# Patient Record
Sex: Female | Born: 1960 | Race: White | Hispanic: No | Marital: Married | Smoking: Never smoker
Health system: Southern US, Community
[De-identification: ages and names within clinical notes are randomized; demographics above are authoritative.]

## PROBLEM LIST (undated history)

## (undated) DIAGNOSIS — M1990 Unspecified osteoarthritis, unspecified site: Secondary | ICD-10-CM

## (undated) DIAGNOSIS — E669 Obesity, unspecified: Secondary | ICD-10-CM

## (undated) DIAGNOSIS — N951 Menopausal and female climacteric states: Secondary | ICD-10-CM

## (undated) DIAGNOSIS — E785 Hyperlipidemia, unspecified: Secondary | ICD-10-CM

## (undated) DIAGNOSIS — C441192 Basal cell carcinoma of skin of left lower eyelid, including canthus: Secondary | ICD-10-CM

## (undated) DIAGNOSIS — R011 Cardiac murmur, unspecified: Secondary | ICD-10-CM

## (undated) DIAGNOSIS — R790 Abnormal level of blood mineral: Secondary | ICD-10-CM

## (undated) DIAGNOSIS — H409 Unspecified glaucoma: Secondary | ICD-10-CM

## (undated) DIAGNOSIS — T7840XA Allergy, unspecified, initial encounter: Secondary | ICD-10-CM

## (undated) DIAGNOSIS — B009 Herpesviral infection, unspecified: Secondary | ICD-10-CM

## (undated) DIAGNOSIS — I10 Essential (primary) hypertension: Secondary | ICD-10-CM

## (undated) DIAGNOSIS — C801 Malignant (primary) neoplasm, unspecified: Secondary | ICD-10-CM

## (undated) DIAGNOSIS — D649 Anemia, unspecified: Secondary | ICD-10-CM

## (undated) DIAGNOSIS — R251 Tremor, unspecified: Secondary | ICD-10-CM

## (undated) HISTORY — PX: MASS EXCISION: SHX2000

## (undated) HISTORY — DX: Hyperlipidemia, unspecified: E78.5

## (undated) HISTORY — DX: Allergy, unspecified, initial encounter: T78.40XA

## (undated) HISTORY — DX: Anemia, unspecified: D64.9

## (undated) HISTORY — DX: Unspecified glaucoma: H40.9

## (undated) HISTORY — DX: Herpesviral infection, unspecified: B00.9

## (undated) HISTORY — PX: TUBAL LIGATION: SHX77

## (undated) HISTORY — DX: Tremor, unspecified: R25.1

## (undated) HISTORY — PX: APPENDECTOMY: SHX54

## (undated) HISTORY — DX: Menopausal and female climacteric states: N95.1

## (undated) HISTORY — DX: Basal cell carcinoma of skin of left lower eyelid, including canthus: C44.1192

## (undated) HISTORY — DX: Abnormal level of blood mineral: R79.0

## (undated) HISTORY — DX: Unspecified osteoarthritis, unspecified site: M19.90

## (undated) HISTORY — DX: Essential (primary) hypertension: I10

## (undated) HISTORY — DX: Obesity, unspecified: E66.9

## (undated) HISTORY — PX: JOINT REPLACEMENT: SHX530

---

## 2009-01-03 ENCOUNTER — Encounter: Admission: RE | Admit: 2009-01-03 | Discharge: 2009-01-03 | Payer: Self-pay | Admitting: Orthopedic Surgery

## 2009-06-05 ENCOUNTER — Ambulatory Visit: Payer: Self-pay | Admitting: Family Medicine

## 2009-07-14 ENCOUNTER — Ambulatory Visit: Payer: Self-pay | Admitting: Family Medicine

## 2010-02-08 ENCOUNTER — Encounter: Payer: Self-pay | Admitting: Orthopedic Surgery

## 2010-12-08 ENCOUNTER — Ambulatory Visit: Payer: Self-pay | Admitting: Family Medicine

## 2011-07-21 ENCOUNTER — Encounter (HOSPITAL_COMMUNITY): Payer: Self-pay | Admitting: Pharmacy Technician

## 2011-07-27 ENCOUNTER — Other Ambulatory Visit (HOSPITAL_COMMUNITY): Payer: Self-pay

## 2011-07-28 NOTE — Pre-Procedure Instructions (Signed)
20 SIMRIT GOHLKE  07/28/2011   Your procedure is scheduled on:  08-04-2011  Report to [facility] at 6:30 AM.  Call this number if you have problems the morning of surgery: [phone]   Remember:   Do not eat food or drink:After Midnight.    .  Take these medicines the morning of surgery with A SIP OF WATER:    Do not wear jewelry, make-up or nail polish.  Do not wear lotions, powders, or perfumes. You may wear deodorant.  Do not shave 48 hours prior to surgery. Men may shave face and neck.  Do not bring valuables to the hospital.  Contacts, dentures or bridgework may not be worn into surgery.  Leave suitcase in the car. After surgery it may be brought to your room.  For patients admitted to the hospital, checkout time is 11:00 AM the day of discharge.      Special Instructions: Incentive Spirometry - Practice and bring it with you on the day of surgery. and CHG Shower Use Special Wash: 1/2 bottle night before surgery and 1/2 bottle morning of surgery.   Please read over the following fact sheets that you were given: Pain Booklet, Coughing and Deep Breathing, Blood Transfusion Information, MRSA Information and Surgical Site Infection Prevention

## 2011-07-28 NOTE — H&P (Signed)
  [facility] [address]   [address] [place], [place] [address] [phone] A Division of [facility]  Loreta Ave, M.D.     Robert A. Thurston Hole, M.D.     Lunette Stands, M.D. Eulas Post, M.D.    Buford Dresser, M.D. Estell Harpin, M.D. Ralene Cork, D.O.          Genene Churn. Barry Dienes, PA-C            Kirstin A. Shepperson, PA-C Janace Litten, OPA-C   RE: Veronica Morton, Veronica Morton                                [id number]      DOB: 09-23-1960 PROGRESS NOTE: 07-27-11 Chief complaint: Right knee pain.  History of present illness: 51 year-old white female with a history of end stage DJD, right knee, and chronic pain.  Returns.  States that knee symptoms are unchanged from previous visit.  She is wanting to proceed with total knee replacement as scheduled.   Current medications: [employer] sleep aid, iron supplement and Calcium supplement. Allergies: No known drug allergies. Past medical/surgical history: Tubal ligation and appendectomy. Review of systems: Patient denies lightheadedness, dizziness, fevers, chills, cardiac, pulmonary, GI, GU or neuro issues. Family history: Positive for heart disease, hypertension, diabetes, stroke, kidney disease and cancer. Social history: Denies smoking, admits occasional alcohol use.  Patient is married.        EXAMINATION: Height: 5?2.  Weight: 200 pounds.  Respirations: 20.  Blood pressure: 137/85.  Temperature: 98.7.  Pulse: 80.  Pleasant white female, alert and oriented x 3 and in no acute distress.  No increase in respiratory effort.  Gait is antalgic.  Head is normocephalic, a traumatic.  PERRLA, EOMI.  Cervical spine unremarkable.  Lungs: CTA bilaterally.  No wheezes.  Heart: RRR.  S1 and S2.  No murmurs.  Abdomen: Round and non-distended.  NBS x 4.  Soft and non-tender.  Right knee: Decreased range of motion.  Positive crepitus.  Joint line tender.  Positive effusion.  Ligaments  stable.  Calf non-tender.  Neurovascularly intact.  Skin warm and dry.   IMPRESSION: End stage DJD, right knee, and chronic pain.  Failed conservative treatment.  PLAN: We will proceed with right total knee replacement as scheduled.  Surgical procedure, along with potential rehab/recovery time discussed.  All questions answered.    Loreta Ave, M.D.   Electronically verified by Loreta Ave, M.D. DFM(JMO):jjh D 07-27-11 T 07-28-11

## 2011-07-29 ENCOUNTER — Encounter (HOSPITAL_COMMUNITY): Payer: Self-pay

## 2011-07-29 ENCOUNTER — Encounter (HOSPITAL_COMMUNITY): Payer: Self-pay | Admitting: Pharmacy Technician

## 2011-07-29 ENCOUNTER — Encounter (HOSPITAL_COMMUNITY)
Admission: RE | Admit: 2011-07-29 | Discharge: 2011-07-29 | Disposition: A | Discharge status: Home / Self Care | Payer: 59 | Source: Ambulatory Visit | Attending: Orthopedic Surgery | Admitting: Orthopedic Surgery

## 2011-07-29 ENCOUNTER — Ambulatory Visit (HOSPITAL_COMMUNITY)
Admission: RE | Admit: 2011-07-29 | Discharge: 2011-07-29 | Disposition: A | Discharge status: Home / Self Care | Payer: 59 | Source: Ambulatory Visit | Attending: Surgery | Admitting: Surgery

## 2011-07-29 DIAGNOSIS — Z01812 Encounter for preprocedural laboratory examination: Secondary | ICD-10-CM | POA: Insufficient documentation

## 2011-07-29 DIAGNOSIS — Z01810 Encounter for preprocedural cardiovascular examination: Secondary | ICD-10-CM | POA: Insufficient documentation

## 2011-07-29 DIAGNOSIS — Z01818 Encounter for other preprocedural examination: Secondary | ICD-10-CM | POA: Insufficient documentation

## 2011-07-29 HISTORY — DX: Cardiac murmur, unspecified: R01.1

## 2011-07-29 LAB — URINALYSIS, ROUTINE W REFLEX MICROSCOPIC
Glucose, UA: NEGATIVE mg/dL
Ketones, ur: NEGATIVE mg/dL
Leukocytes, UA: NEGATIVE
Specific Gravity, Urine: 1.031 — ABNORMAL HIGH (ref 1.005–1.030)
pH: 6 (ref 5.0–8.0)

## 2011-07-29 LAB — PROTIME-INR
INR: 1.03 (ref 0.00–1.49)
Prothrombin Time: 13.7 seconds (ref 11.6–15.2)

## 2011-07-29 LAB — COMPREHENSIVE METABOLIC PANEL
AST: 23 U/L (ref 0–37)
BUN: 12 mg/dL (ref 6–23)
CO2: 25 mEq/L (ref 19–32)
Chloride: 105 mEq/L (ref 96–112)
Creatinine, Ser: 0.75 mg/dL (ref 0.50–1.10)
GFR calc non Af Amer: >90 mL/min (ref >90)
Total Bilirubin: 0.9 mg/dL (ref 0.3–1.2)

## 2011-07-29 LAB — CBC
HCT: 39.2 % (ref 36.0–46.0)
MCV: 82.5 fL (ref 78.0–100.0)
RBC: 4.75 MIL/uL (ref 3.87–5.11)
WBC: 7.1 10*3/uL (ref 4.0–10.5)

## 2011-07-29 LAB — SURGICAL PCR SCREEN: Staphylococcus aureus: NEGATIVE

## 2011-08-03 MED ORDER — CEFAZOLIN SODIUM-DEXTROSE 2-3 GM-% IV SOLR
2.0000 g | INTRAVENOUS | Status: AC
  Administered 2011-08-04: 2 g via INTRAVENOUS
  Filled 2011-08-03: qty 50

## 2011-08-04 ENCOUNTER — Encounter (HOSPITAL_COMMUNITY): Payer: Self-pay | Admitting: Anesthesiology

## 2011-08-04 ENCOUNTER — Encounter (HOSPITAL_COMMUNITY)
Admission: RE | Disposition: A | Discharge status: Home / Self Care | Payer: Self-pay | Source: Ambulatory Visit | Attending: Orthopedic Surgery

## 2011-08-04 ENCOUNTER — Inpatient Hospital Stay (HOSPITAL_COMMUNITY)
Admission: RE | Admit: 2011-08-04 | Discharge: 2011-08-06 | DRG: 470 | Disposition: A | Discharge status: Home / Self Care | Payer: 59 | Source: Ambulatory Visit | Attending: Orthopedic Surgery | Admitting: Orthopedic Surgery

## 2011-08-04 ENCOUNTER — Inpatient Hospital Stay (HOSPITAL_COMMUNITY): Payer: 59

## 2011-08-04 ENCOUNTER — Ambulatory Visit (HOSPITAL_COMMUNITY): Payer: 59 | Admitting: Anesthesiology

## 2011-08-04 ENCOUNTER — Encounter (HOSPITAL_COMMUNITY): Payer: Self-pay | Admitting: *Deleted

## 2011-08-04 DIAGNOSIS — G8929 Other chronic pain: Secondary | ICD-10-CM | POA: Diagnosis present

## 2011-08-04 DIAGNOSIS — M1710 Unilateral primary osteoarthritis, unspecified knee: Principal | ICD-10-CM | POA: Diagnosis present

## 2011-08-04 DIAGNOSIS — Z471 Aftercare following joint replacement surgery: Secondary | ICD-10-CM

## 2011-08-04 HISTORY — PX: TOTAL KNEE ARTHROPLASTY: SHX125

## 2011-08-04 SURGERY — ARTHROPLASTY, KNEE, TOTAL
Anesthesia: General | Site: Knee | Laterality: Right | Wound class: Clean

## 2011-08-04 MED ORDER — MORPHINE SULFATE 4 MG/ML IJ SOLN
INTRAMUSCULAR | Status: AC
  Filled 2011-08-04: qty 1

## 2011-08-04 MED ORDER — ONDANSETRON HCL 4 MG/2ML IJ SOLN
4.0000 mg | Freq: Four times a day (QID) | INTRAMUSCULAR | Status: DC | PRN
  Administered 2011-08-05: 4 mg via INTRAVENOUS
  Filled 2011-08-04: qty 2

## 2011-08-04 MED ORDER — DEXAMETHASONE SODIUM PHOSPHATE 4 MG/ML IJ SOLN
INTRAMUSCULAR | Status: DC | PRN
  Administered 2011-08-04: 4 mg via INTRAVENOUS

## 2011-08-04 MED ORDER — BISACODYL 10 MG RE SUPP: 10.0000 mg | Freq: Every day | RECTAL | Status: DC | PRN

## 2011-08-04 MED ORDER — ONDANSETRON HCL 4 MG/2ML IJ SOLN: 4.0000 mg | Freq: Once | INTRAMUSCULAR | Status: DC | PRN

## 2011-08-04 MED ORDER — MIDAZOLAM HCL 5 MG/5ML IJ SOLN
INTRAMUSCULAR | Status: DC | PRN
  Administered 2011-08-04: 2 mg via INTRAVENOUS

## 2011-08-04 MED ORDER — HYDROMORPHONE HCL PF 1 MG/ML IJ SOLN
0.2500 mg | INTRAMUSCULAR | Status: DC | PRN
  Administered 2011-08-04: 0.25 mg via INTRAVENOUS

## 2011-08-04 MED ORDER — ONDANSETRON HCL 4 MG/2ML IJ SOLN: 4.0000 mg | Freq: Four times a day (QID) | INTRAMUSCULAR | Status: DC | PRN

## 2011-08-04 MED ORDER — OXYCODONE-ACETAMINOPHEN 5-325 MG PO TABS
ORAL_TABLET | ORAL | Status: AC
  Administered 2011-08-05: 1 via ORAL
  Filled 2011-08-04: qty 2

## 2011-08-04 MED ORDER — NALOXONE HCL 0.4 MG/ML IJ SOLN: 0.4000 mg | INTRAMUSCULAR | Status: DC | PRN

## 2011-08-04 MED ORDER — MORPHINE SULFATE 4 MG/ML IJ SOLN
INTRAMUSCULAR | Status: DC | PRN
  Administered 2011-08-04: 4 mg via INTRAVENOUS

## 2011-08-04 MED ORDER — ACETAMINOPHEN 325 MG PO TABS: 650.0000 mg | ORAL_TABLET | Freq: Four times a day (QID) | ORAL | Status: DC | PRN

## 2011-08-04 MED ORDER — POTASSIUM CHLORIDE IN NACL 20-0.9 MEQ/L-% IV SOLN
INTRAVENOUS | Status: DC
  Administered 2011-08-04 – 2011-08-05 (×2): via INTRAVENOUS
  Filled 2011-08-04 (×6): qty 1000

## 2011-08-04 MED ORDER — ACETAMINOPHEN 650 MG RE SUPP: 650.0000 mg | Freq: Four times a day (QID) | RECTAL | Status: DC | PRN

## 2011-08-04 MED ORDER — ACETAMINOPHEN 10 MG/ML IV SOLN
INTRAVENOUS | Status: DC | PRN
  Administered 2011-08-04: 1000 mg via INTRAVENOUS

## 2011-08-04 MED ORDER — WARFARIN - PHARMACIST DOSING INPATIENT
Freq: Every day | Status: DC
  Administered 2011-08-04: 18:00:00

## 2011-08-04 MED ORDER — METHOCARBAMOL 500 MG PO TABS
500.0000 mg | ORAL_TABLET | Freq: Four times a day (QID) | ORAL | Status: DC | PRN
  Administered 2011-08-05 – 2011-08-06 (×3): 500 mg via ORAL
  Filled 2011-08-04 (×3): qty 1

## 2011-08-04 MED ORDER — ALUM & MAG HYDROXIDE-SIMETH 200-200-20 MG/5ML PO SUSP: 30.0000 mL | ORAL | Status: DC | PRN

## 2011-08-04 MED ORDER — WARFARIN VIDEO
Freq: Once | Status: AC
  Administered 2011-08-05: 12:00:00

## 2011-08-04 MED ORDER — BUPIVACAINE HCL (PF) 0.25 % IJ SOLN
INTRAMUSCULAR | Status: DC | PRN
  Administered 2011-08-04: 30 mL

## 2011-08-04 MED ORDER — SENNOSIDES-DOCUSATE SODIUM 8.6-50 MG PO TABS: 1.0000 | ORAL_TABLET | Freq: Every evening | ORAL | Status: DC | PRN

## 2011-08-04 MED ORDER — LIDOCAINE HCL (CARDIAC) 20 MG/ML IV SOLN
INTRAVENOUS | Status: DC | PRN
  Administered 2011-08-04: 80 mg via INTRAVENOUS

## 2011-08-04 MED ORDER — COUMADIN BOOK
Freq: Once | Status: AC
  Administered 2011-08-04: 14:00:00
  Filled 2011-08-04: qty 1

## 2011-08-04 MED ORDER — BUPIVACAINE-EPINEPHRINE PF 0.5-1:200000 % IJ SOLN
INTRAMUSCULAR | Status: DC | PRN
  Administered 2011-08-04: 20 mL

## 2011-08-04 MED ORDER — ENOXAPARIN SODIUM 30 MG/0.3ML ~~LOC~~ SOLN
30.0000 mg | Freq: Two times a day (BID) | SUBCUTANEOUS | Status: DC
  Administered 2011-08-05 – 2011-08-06 (×3): 30 mg via SUBCUTANEOUS
  Filled 2011-08-04 (×5): qty 0.3

## 2011-08-04 MED ORDER — DOCUSATE SODIUM 100 MG PO CAPS
100.0000 mg | ORAL_CAPSULE | Freq: Two times a day (BID) | ORAL | Status: DC
  Administered 2011-08-04 – 2011-08-06 (×4): 100 mg via ORAL
  Filled 2011-08-04 (×5): qty 1

## 2011-08-04 MED ORDER — ONDANSETRON HCL 4 MG/2ML IJ SOLN
INTRAMUSCULAR | Status: DC | PRN
  Administered 2011-08-04: 4 mg via INTRAVENOUS

## 2011-08-04 MED ORDER — ACETAMINOPHEN 10 MG/ML IV SOLN
INTRAVENOUS | Status: AC
  Filled 2011-08-04: qty 100

## 2011-08-04 MED ORDER — LACTATED RINGERS IV SOLN
INTRAVENOUS | Status: DC | PRN
  Administered 2011-08-04 (×2): via INTRAVENOUS

## 2011-08-04 MED ORDER — HYDROMORPHONE HCL PF 1 MG/ML IJ SOLN
INTRAMUSCULAR | Status: AC
  Filled 2011-08-04: qty 1

## 2011-08-04 MED ORDER — OXYCODONE-ACETAMINOPHEN 5-325 MG PO TABS
1.0000 | ORAL_TABLET | ORAL | Status: DC | PRN
  Administered 2011-08-04: 2 via ORAL
  Administered 2011-08-05 (×3): 1 via ORAL
  Administered 2011-08-06: 2 via ORAL
  Administered 2011-08-06 (×3): 1 via ORAL
  Filled 2011-08-04 (×2): qty 1
  Filled 2011-08-04: qty 2
  Filled 2011-08-04 (×4): qty 1

## 2011-08-04 MED ORDER — FENTANYL CITRATE 0.05 MG/ML IJ SOLN
INTRAMUSCULAR | Status: DC | PRN
  Administered 2011-08-04: 100 ug via INTRAVENOUS
  Administered 2011-08-04 (×2): 50 ug via INTRAVENOUS

## 2011-08-04 MED ORDER — DEXTROSE 5 % IV SOLN
500.0000 mg | Freq: Four times a day (QID) | INTRAVENOUS | Status: DC | PRN
  Filled 2011-08-04: qty 5

## 2011-08-04 MED ORDER — SODIUM CHLORIDE 0.9 % IR SOLN
Status: DC | PRN
  Administered 2011-08-04: 1000 mL
  Administered 2011-08-04: 3000 mL

## 2011-08-04 MED ORDER — WARFARIN SODIUM 7.5 MG PO TABS
7.5000 mg | ORAL_TABLET | Freq: Once | ORAL | Status: AC
  Administered 2011-08-04: 7.5 mg via ORAL
  Filled 2011-08-04: qty 1

## 2011-08-04 MED ORDER — MORPHINE SULFATE (PF) 1 MG/ML IV SOLN
INTRAVENOUS | Status: DC
  Administered 2011-08-04: 21:00:00 via INTRAVENOUS
  Administered 2011-08-04: 28.03 mg via INTRAVENOUS
  Administered 2011-08-04: 12:00:00 via INTRAVENOUS
  Administered 2011-08-05: 6 mg via INTRAVENOUS
  Administered 2011-08-05: 06:00:00 via INTRAVENOUS
  Administered 2011-08-05: 6 mg via INTRAVENOUS
  Filled 2011-08-04 (×4): qty 25

## 2011-08-04 MED ORDER — SODIUM CHLORIDE 0.9 % IJ SOLN: 9.0000 mL | INTRAMUSCULAR | Status: DC | PRN

## 2011-08-04 MED ORDER — METHOCARBAMOL 500 MG PO TABS
ORAL_TABLET | ORAL | Status: AC
  Administered 2011-08-05: 500 mg via ORAL
  Filled 2011-08-04: qty 1

## 2011-08-04 MED ORDER — DIPHENHYDRAMINE HCL 50 MG/ML IJ SOLN: 12.5000 mg | Freq: Four times a day (QID) | INTRAMUSCULAR | Status: DC | PRN

## 2011-08-04 MED ORDER — CEFAZOLIN SODIUM 1-5 GM-% IV SOLN
1.0000 g | Freq: Three times a day (TID) | INTRAVENOUS | Status: AC
  Administered 2011-08-04 – 2011-08-05 (×3): 1 g via INTRAVENOUS
  Filled 2011-08-04 (×4): qty 50

## 2011-08-04 MED ORDER — PROPOFOL 10 MG/ML IV BOLUS
INTRAVENOUS | Status: DC | PRN
  Administered 2011-08-04: 200 mg via INTRAVENOUS

## 2011-08-04 MED ORDER — BUPIVACAINE HCL (PF) 0.25 % IJ SOLN
INTRAMUSCULAR | Status: AC
  Filled 2011-08-04: qty 30

## 2011-08-04 MED ORDER — PHENOL 1.4 % MT LIQD: 1.0000 | OROMUCOSAL | Status: DC | PRN

## 2011-08-04 MED ORDER — DIPHENHYDRAMINE HCL 12.5 MG/5ML PO ELIX
12.5000 mg | ORAL_SOLUTION | Freq: Four times a day (QID) | ORAL | Status: DC | PRN
  Administered 2011-08-04: 12.5 mg via ORAL
  Filled 2011-08-04: qty 5

## 2011-08-04 MED ORDER — MORPHINE SULFATE (PF) 1 MG/ML IV SOLN
INTRAVENOUS | Status: AC
  Filled 2011-08-04: qty 25

## 2011-08-04 MED ORDER — MENTHOL 3 MG MT LOZG: 1.0000 | LOZENGE | OROMUCOSAL | Status: DC | PRN

## 2011-08-04 MED ORDER — ONDANSETRON HCL 4 MG PO TABS: 4.0000 mg | ORAL_TABLET | Freq: Four times a day (QID) | ORAL | Status: DC | PRN

## 2011-08-04 SURGICAL SUPPLY — 57 items
BANDAGE ESMARK 6X9 LF (GAUZE/BANDAGES/DRESSINGS) ×1 IMPLANT
BLADE SAG 18X100X1.27 (BLADE) ×4 IMPLANT
BNDG CMPR 9X6 STRL LF SNTH (GAUZE/BANDAGES/DRESSINGS) ×1
BNDG ESMARK 6X9 LF (GAUZE/BANDAGES/DRESSINGS) ×2
BOOTCOVER CLEANROOM LRG (PROTECTIVE WEAR) ×4 IMPLANT
BOWL SMART MIX CTS (DISPOSABLE) ×2 IMPLANT
CEMENT BONE SIMPLEX SPEEDSET (Cement) ×4 IMPLANT
CLOTH BEACON ORANGE TIMEOUT ST (SAFETY) ×2 IMPLANT
COVER BACK TABLE 24X17X13 BIG (DRAPES) ×1 IMPLANT
COVER SURGICAL LIGHT HANDLE (MISCELLANEOUS) ×2 IMPLANT
CUFF TOURNIQUET SINGLE 34IN LL (TOURNIQUET CUFF) ×2 IMPLANT
DRAPE EXTREMITY T 121X128X90 (DRAPE) ×2 IMPLANT
DRAPE PROXIMA HALF (DRAPES) ×2 IMPLANT
DRAPE U-SHAPE 47X51 STRL (DRAPES) ×2 IMPLANT
DRSG PAD ABDOMINAL 8X10 ST (GAUZE/BANDAGES/DRESSINGS) ×3 IMPLANT
DURAPREP 26ML APPLICATOR (WOUND CARE) ×2 IMPLANT
ELECT CAUTERY BLADE 6.4 (BLADE) ×2 IMPLANT
ELECT REM PT RETURN 9FT ADLT (ELECTROSURGICAL) ×2
ELECTRODE REM PT RTRN 9FT ADLT (ELECTROSURGICAL) ×1 IMPLANT
EVACUATOR 1/8 PVC DRAIN (DRAIN) ×2 IMPLANT
FACESHIELD LNG OPTICON STERILE (SAFETY) ×2 IMPLANT
GAUZE XEROFORM 5X9 LF (GAUZE/BANDAGES/DRESSINGS) ×2 IMPLANT
GLOVE BIOGEL PI IND STRL 8 (GLOVE) ×1 IMPLANT
GLOVE BIOGEL PI INDICATOR 8 (GLOVE) ×1
GLOVE ORTHO TXT STRL SZ7.5 (GLOVE) ×4 IMPLANT
GLOVE SURG SS PI 7.5 STRL IVOR (GLOVE) ×1 IMPLANT
GOWN PREVENTION PLUS XLARGE (GOWN DISPOSABLE) ×3 IMPLANT
GOWN STRL NON-REIN LRG LVL3 (GOWN DISPOSABLE) ×3 IMPLANT
GOWN STRL REIN 2XL XLG LVL4 (GOWN DISPOSABLE) ×2 IMPLANT
HANDPIECE INTERPULSE COAX TIP (DISPOSABLE) ×2
IMMOBILIZER KNEE 22 UNIV (SOFTGOODS) ×2 IMPLANT
IMMOBILIZER KNEE 24 THIGH 36 (MISCELLANEOUS) IMPLANT
IMMOBILIZER KNEE 24 UNIV (MISCELLANEOUS)
KIT BASIN OR (CUSTOM PROCEDURE TRAY) ×2 IMPLANT
KIT ROOM TURNOVER OR (KITS) ×2 IMPLANT
MANIFOLD NEPTUNE II (INSTRUMENTS) ×2 IMPLANT
NS IRRIG 1000ML POUR BTL (IV SOLUTION) ×2 IMPLANT
PACK TOTAL JOINT (CUSTOM PROCEDURE TRAY) ×2 IMPLANT
PAD ARMBOARD 7.5X6 YLW CONV (MISCELLANEOUS) ×4 IMPLANT
PAD CAST 4YDX4 CTTN HI CHSV (CAST SUPPLIES) ×1 IMPLANT
PADDING CAST COTTON 4X4 STRL (CAST SUPPLIES) ×2
PADDING CAST COTTON 6X4 STRL (CAST SUPPLIES) ×2 IMPLANT
RUBBERBAND STERILE (MISCELLANEOUS) ×2 IMPLANT
SET HNDPC FAN SPRY TIP SCT (DISPOSABLE) ×1 IMPLANT
SPONGE GAUZE 4X4 12PLY (GAUZE/BANDAGES/DRESSINGS) ×2 IMPLANT
STAPLER VISISTAT 35W (STAPLE) ×2 IMPLANT
SUCTION FRAZIER TIP 10 FR DISP (SUCTIONS) ×2 IMPLANT
SUT VIC AB 1 CTX 36 (SUTURE) ×4
SUT VIC AB 1 CTX36XBRD ANBCTR (SUTURE) ×2 IMPLANT
SUT VIC AB 2-0 CT1 27 (SUTURE) ×6
SUT VIC AB 2-0 CT1 TAPERPNT 27 (SUTURE) ×2 IMPLANT
SYR 30ML LL (SYRINGE) ×1 IMPLANT
SYR 30ML SLIP (SYRINGE) ×2 IMPLANT
TOWEL OR 17X24 6PK STRL BLUE (TOWEL DISPOSABLE) ×2 IMPLANT
TOWEL OR 17X26 10 PK STRL BLUE (TOWEL DISPOSABLE) ×2 IMPLANT
TRAY FOLEY CATH 14FR (SET/KITS/TRAYS/PACK) ×2 IMPLANT
WATER STERILE IRR 1000ML POUR (IV SOLUTION) ×4 IMPLANT

## 2011-08-04 NOTE — Transfer of Care (Signed)
Immediate Anesthesia Transfer of Care Note  Patient: Veronica Morton  Procedure(s) Performed: Procedure(s) (LRB): TOTAL KNEE ARTHROPLASTY (Right)  Patient Location: PACU  Anesthesia Type: General  Level of Consciousness: awake, alert  and oriented  Airway & Oxygen Therapy: Patient Spontanous Breathing and Patient connected to nasal cannula oxygen  Post-op Assessment: Report given to PACU RN and Post -op Vital signs reviewed and stable  Post vital signs: Reviewed and stable  Complications: No apparent anesthesia complications

## 2011-08-04 NOTE — Progress Notes (Signed)
ANTICOAGULATION CONSULT NOTE - Initial Consult  Pharmacy Consult for warfarin Indication: VTE prophylaxis  No Known Allergies  Patient Measurements: Ht: 62 in Wt: 90.6 kg IBW: 50.1 kg AdjBW: 66.3 kg  Vital Signs: Temp: 97.9 F (36.6 C) (07/17 1010) Temp src: Oral (07/17 0706) BP: 132/88 mmHg (07/17 1035) Pulse Rate: 76  (07/17 1042)  Labs: (7/11) Est. CrCl 46mL/min (using AdjBW) INR- 1.03 Hgb- 13.4 Hct- 39.2 Plt- 204   Medical History: Past Medical History  Diagnosis Date  . Murmur     Medications:  Scheduled:     .  ceFAZolin (ANCEF) IV  1 g Intravenous Q8H  .  ceFAZolin (ANCEF) IV  2 g Intravenous 60 min Pre-Op  . docusate sodium  100 mg Oral BID  . enoxaparin (LOVENOX) injection  30 mg Subcutaneous Q12H  . HYDROmorphone      . methocarbamol      . morphine   Intravenous Q4H  . morphine      . morphine      . oxyCODONE-acetaminophen        Assessment: 50 YOF s/p total knee arthroplasty on 7/17. Pharmacy consulted to dose warfarin for VTE prophylaxis. Patient is not currently taking any inducers/inhibitors of warfarin. Baseline labs are WNL. Pt also on Lovenox for VTE prophylaxis  Goal of Therapy:  INR 2-3 Monitor platelets by anticoagulation protocol: Yes   Plan:  1. Warfarin 7.5mg  po x1 tonight 2. Daily PT/INR 3. Follow CBC and SCr 4. Will order warfarin educational video and book 5. Follow-up discontinuation of Lovenox when INR therapeutic   Maricella Filyaw D. Yesica Kemler, PharmD Clinical Pharmacist Pager: [phone] Phone: [phone] 08/04/2011 11:48 AM

## 2011-08-04 NOTE — Anesthesia Preprocedure Evaluation (Signed)
Anesthesia Evaluation  Patient identified by MRN, date of birth, ID band Patient awake    Reviewed: Allergy & Precautions, H&P , NPO status , Patient's Chart, lab work & pertinent test results  Airway Mallampati: I TM Distance: >3 FB Neck ROM: full    Dental   Pulmonary          Cardiovascular Rhythm:regular Rate:Normal     Neuro/Psych    GI/Hepatic   Endo/Other    Renal/GU      Musculoskeletal   Abdominal   Peds  Hematology   Anesthesia Other Findings   Reproductive/Obstetrics                           Anesthesia Physical Anesthesia Plan  ASA: I  Anesthesia Plan: General   Post-op Pain Management:    Induction: Intravenous  Airway Management Planned: LMA and Oral ETT  Additional Equipment:   Intra-op Plan:   Post-operative Plan: Extubation in OR  Informed Consent: I have reviewed the patients History and Physical, chart, labs and discussed the procedure including the risks, benefits and alternatives for the proposed anesthesia with the patient or authorized representative who has indicated his/her understanding and acceptance.     Plan Discussed with: CRNA, Anesthesiologist and Surgeon  Anesthesia Plan Comments:         Anesthesia Quick Evaluation  

## 2011-08-04 NOTE — Anesthesia Procedure Notes (Addendum)
Procedure Name: LMA Insertion Date/Time: 08/04/2011 7:45 AM Performed by: Marena Chancy Pre-anesthesia Checklist: Patient identified, Timeout performed, Emergency Drugs available, Suction available and Patient being monitored Patient Re-evaluated:Patient Re-evaluated prior to inductionOxygen Delivery Method: Circle system utilized Preoxygenation: Pre-oxygenation with 100% oxygen Intubation Type: IV induction LMA: LMA inserted LMA Size: 4.0 Number of attempts: 1 Placement Confirmation: breath sounds checked- equal and bilateral and positive ETCO2 Tube secured with: Tape Dental Injury: Teeth and Oropharynx as per pre-operative assessment    Anesthesia Regional Block:  Femoral nerve block  Pre-Anesthetic Checklist: ,, timeout performed, Correct Patient, Correct Site, Correct Laterality, Correct Procedure, Correct Position, site marked, Risks and benefits discussed,  Surgical consent,  Pre-op evaluation,  At surgeon's request and post-op pain management  Laterality: Right  Prep: Maximum Sterile Barrier Precautions used, chloraprep and alcohol swabs       Needles:  Injection technique: Single-shot  Needle Type: Stimulator Needle - 80        Needle insertion depth: 6 cm   Additional Needles:  Procedures: nerve stimulator Femoral nerve block  Nerve Stimulator or Paresthesia:  Response: 0.5 mA, 0.1 ms, 6 cm  Additional Responses:   Narrative:  Start time: 08/04/2011 8:10 AM End time: 08/04/2011 8:15 AM Injection made incrementally with aspirations every 5 mL.  Performed by: Personally  Anesthesiologist: Maren Beach MD  Additional Notes: 22cc 0.5% Marcaine w/ epi w/o difficulty or discomfort GES

## 2011-08-04 NOTE — Progress Notes (Signed)
I have reviewed the resident's note and agree with the assessment and plan.  [place], [address].D., BCPS Clinical Pharmacist Pager: [phone] 08/04/2011 12:00 PM

## 2011-08-04 NOTE — Progress Notes (Signed)
Unable to verify meds and chart due to chart opened.

## 2011-08-04 NOTE — Interval H&P Note (Signed)
History and Physical Interval Note:  08/04/2011 8:07 AM  Veronica Morton  has presented today for surgery, with the diagnosis of DJD RIGHT KNEE  The various methods of treatment have been discussed with the patient and family. After consideration of risks, benefits and other options for treatment, the patient has consented to  Procedure(s) (LRB): TOTAL KNEE ARTHROPLASTY (Right) as a surgical intervention .  The patient's history has been reviewed, patient examined, no change in status, stable for surgery.  I have reviewed the patients' chart and labs.  Questions were answered to the patient's satisfaction.     Quanta Robertshaw F

## 2011-08-04 NOTE — Anesthesia Postprocedure Evaluation (Signed)
  Anesthesia Post-op Note  Patient: Veronica Morton  Procedure(s) Performed: Procedure(s) (LRB): TOTAL KNEE ARTHROPLASTY (Right)  Patient Location: PACU and PICU  Anesthesia Type: GA combined with regional for post-op pain  Level of Consciousness: awake, alert , oriented and patient cooperative  Airway and Oxygen Therapy: Patient Spontanous Breathing and Patient connected to nasal cannula oxygen  Post-op Pain: mild  Post-op Assessment: Post-op Vital signs reviewed, Patient's Cardiovascular Status Stable, Respiratory Function Stable, Patent Airway, No signs of Nausea or vomiting and Pain level controlled  Post-op Vital Signs: stable  Complications: No apparent anesthesia complications

## 2011-08-04 NOTE — Progress Notes (Signed)
Orthopedic Tech Progress Note Patient Details:  Veronica Morton 03-09-1960 [id number]  CPM Right Knee CPM Right Knee: On Right Knee Flexion (Degrees): 60  Right Knee Extension (Degrees): 0  Additional Comments: trapeze bar   Cammer, Mickie Bail 08/04/2011, 1:07 PM

## 2011-08-04 NOTE — Brief Op Note (Signed)
08/04/2011  12:50 PM  PATIENT:  [name]  51 y.o. female  PRE-OPERATIVE DIAGNOSIS:  Degererative joint disease, RIGHT KNEE  POST-OPERATIVE DIAGNOSIS:  Degererative joint disease, RIGHT KNEE  PROCEDURE:  Procedure(s) (LRB): TOTAL KNEE ARTHROPLASTY (Right)  SURGEON:  Surgeon(s) and Role:    * Loreta Ave, MD - Primary  PHYSICIAN ASSISTANT: Zonia Kief M  ANESTHESIA:   regional and general  EBL:  Total I/O In: 1200 [I.V.:1200] Out: 350 [Urine:300; Blood:50]   SPECIMEN:  No Specimen  DISPOSITION OF SPECIMEN:  N/A  COUNTS:  YES  TOURNIQUET:   Total Tourniquet Time Documented: Thigh (Right) - 63 minutes  PATIENT DISPOSITION:  PACU - hemodynamically stable.

## 2011-08-04 NOTE — Preoperative (Signed)
Beta Blockers   Reason not to administer Beta Blockers:Not Applicable 

## 2011-08-05 ENCOUNTER — Encounter (HOSPITAL_COMMUNITY): Payer: Self-pay | Admitting: Orthopedic Surgery

## 2011-08-05 LAB — CBC
Hemoglobin: 10.3 g/dL — ABNORMAL LOW (ref 12.0–15.0)
MCH: 28.4 pg (ref 26.0–34.0)
RBC: 3.63 MIL/uL — ABNORMAL LOW (ref 3.87–5.11)
WBC: 8.1 10*3/uL (ref 4.0–10.5)

## 2011-08-05 LAB — BASIC METABOLIC PANEL
CO2: 25 mEq/L (ref 19–32)
Calcium: 8.4 mg/dL (ref 8.4–10.5)
Chloride: 103 mEq/L (ref 96–112)
Glucose, Bld: 133 mg/dL — ABNORMAL HIGH (ref 70–99)
Potassium: 3.6 mEq/L (ref 3.5–5.1)
Sodium: 137 mEq/L (ref 135–145)

## 2011-08-05 LAB — PROTIME-INR: Prothrombin Time: 15.8 seconds — ABNORMAL HIGH (ref 11.6–15.2)

## 2011-08-05 MED ORDER — WARFARIN SODIUM 7.5 MG PO TABS
7.5000 mg | ORAL_TABLET | Freq: Once | ORAL | Status: AC
  Administered 2011-08-05: 7.5 mg via ORAL
  Filled 2011-08-05: qty 1

## 2011-08-05 NOTE — Evaluation (Signed)
Occupational Therapy Evaluation Patient Details Name: Veronica Morton MRN: [id number] DOB: 11-20-1960 Today's Date: 08/05/2011 Time: 4782-9562 OT Time Calculation (min): 16 min  OT Assessment / Plan / Recommendation Clinical Impression  Pt s/p Rt TKA and presents with pain, generalized weakness and overall decreased I with ADL. Pt will benefit from skilled OT in the acute setting to maximize I with ADL and ADL mobility prior to d/c.     OT Assessment  Patient needs continued OT Services    Follow Up Recommendations  Home health OT;Supervision/Assistance - 24 hour    Barriers to Discharge      Equipment Recommendations  None recommended by OT;None recommended by PT    Recommendations for Other Services    Frequency  Min 2X/week    Precautions / Restrictions Precautions Precautions: Knee Required Braces or Orthoses: Knee Immobilizer - Right Knee Immobilizer - Right: On when out of bed or walking Restrictions RLE Weight Bearing: Weight bearing as tolerated   Pertinent Vitals/Pain Pt reports 5/10 back pain; pre-medicated    ADL  Lower Body Dressing: Simulated;+1 Total assistance Where Assessed - Lower Body Dressing: Supported sit to stand Toilet Transfer: Mining engineer Method: Sit to Barista:  (from chair with armrests) Toileting - Clothing Manipulation and Hygiene: Simulated;+1 Total assistance Where Assessed - Toileting Clothing Manipulation and Hygiene: Standing Transfers/Ambulation Related to ADLs: Pt unable to ambulate this session secondary to N/V ADL Comments: Limited eval secondary pt with N/V. Educated pt on use of AE- pt had already Engineer, manufacturing systems in preparation for surgery and states she practiced before sx. Demonstrated backward shower entry with use of verbal teach back method. Pt unable to return demonstration secondary to N/V    OT Diagnosis: Generalized weakness;Acute pain  OT Problem List: Decreased  activity tolerance;Impaired balance (sitting and/or standing);Decreased knowledge of use of DME or AE;Pain;Increased edema OT Treatment Interventions: Self-care/ADL training;DME and/or AE instruction;Therapeutic activities;Patient/family education;Balance training   OT Goals Acute Rehab OT Goals OT Goal Formulation: With patient Time For Goal Achievement: 08/12/11 Potential to Achieve Goals: Good ADL Goals Pt Will Perform Grooming: with modified independence;Standing at sink ADL Goal: Grooming - Progress: Goal set today Pt Will Perform Lower Body Bathing: with supervision;Sit to stand in shower ADL Goal: Lower Body Bathing - Progress: Goal set today Pt Will Perform Lower Body Dressing: with min assist;with adaptive equipment;Sit to stand from chair;Sit to stand from bed ADL Goal: Lower Body Dressing - Progress: Goal set today Pt Will Transfer to Toilet: with modified independence;with DME;Ambulation ADL Goal: Toilet Transfer - Progress: Goal set today Pt Will Perform Toileting - Clothing Manipulation: with modified independence;Standing ADL Goal: Toileting - Clothing Manipulation - Progress: Goal set today Pt Will Perform Tub/Shower Transfer: Shower transfer;Ambulation;with DME ADL Goal: Tub/Shower Transfer - Progress: Goal set today  Visit Information  Last OT Received On: 08/05/11 Assistance Needed: +1    Subjective Data  Subjective: I'm starting to feel light-headed (near end of session) Patient Stated Goal: Return home with family   Prior Functioning  Vision/Perception  Home Living Lives With: Family Available Help at Discharge: Family;Available 24 hours/day Type of Home: House Home Access: Ramped entrance Home Layout: Able to live on main level with bedroom/bathroom Bathroom Shower/Tub: Tub/shower unit;Walk-in shower Bathroom Toilet: Standard Bathroom Accessibility: Yes How Accessible: Accessible via walker Home Adaptive Equipment: Bedside commode/3-in-1;Walker -  rolling;Shower chair with back Prior Function Level of Independence: Independent Able to Take Stairs?: Yes Driving: Yes Vocation: Full time employment Communication  Communication: No difficulties Dominant Hand: Right      Cognition  Overall Cognitive Status: Appears within functional limits for tasks assessed/performed Arousal/Alertness: Awake/alert Orientation Level: Appears intact for tasks assessed Behavior During Session: [employer] [facility] [employer] for tasks performed    Extremity/Trunk Assessment Right Upper Extremity Assessment RUE ROM/Strength/Tone: Within functional levels Left Upper Extremity Assessment LUE ROM/Strength/Tone: Within functional levels   Mobility Transfers Sit to Stand: 4: Min guard;With upper extremity assist;From bed Stand to Sit: 4: Min guard;With armrests;With upper extremity assist;To chair/3-in-1 Details for Transfer Assistance: v/c's for hand placement and walker management   Exercise    Balance    End of Session OT - End of Session Equipment Utilized During Treatment: Gait belt Activity Tolerance: Patient tolerated treatment well Patient left: with call bell/phone within reach;in chair Nurse Communication: Mobility status  GO     Berle Fitz 08/05/2011, 1:18 PM

## 2011-08-05 NOTE — Progress Notes (Signed)
I have reviewed the assessment and plan as outlined by L. Bajbus, Pharm.D.  Agree with Coumadin dosing plan and monitoring.  [employer], Pharm.D., BCPS Clinical Pharmacist Pager [phone] 08/05/2011 4:01 PM

## 2011-08-05 NOTE — Progress Notes (Signed)
Physical Therapy Treatment Note   08/05/11 1402  PT Visit Information  Last PT Received On 08/05/11  Assistance Needed +1  PT Time Calculation  PT Start Time 1402  PT Stop Time 1418  PT Time Calculation (min) 16 min  Subjective Data  Subjective Pt received sitting up in chair with report "I just threw up."  Precautions  Precautions Knee  Required Braces or Orthoses Knee Immobilizer - Right  Knee Immobilizer - Right On when out of bed or walking  Restrictions  RLE Weight Bearing WBAT  Cognition  Overall Cognitive Status Appears within functional limits for tasks assessed/performed  Transfers  Transfers Sit to Stand;Stand to Sit  Sit to Stand 4: Min guard;With upper extremity assist;From bed  Stand to Sit 4: Min guard;With armrests;With upper extremity assist;To chair/3-in-1  Details for Transfer Assistance pt demo'd good technique  Ambulation/Gait  Ambulation/Gait Assistance 4: Min guard  Ambulation Distance (Feet) 50 Feet  Assistive device Rolling walker  Ambulation/Gait Assistance Details pt with increased ability to WB thru R LE  Gait Pattern Step-to pattern;Decreased step length - right;Decreased stance time - right;Antalgic  Gait velocity slow  Exercises  Exercises Total Joint  Total Joint Exercises  Ankle Circles/Pumps AROM;Both;10 reps  Short Arc Quad AROM;Right;10 reps  Knee Flexion 10 reps;AAROM;Right  PT - End of Session  Equipment Utilized During Treatment Gait belt;Right knee immobilizer  Activity Tolerance Patient tolerated treatment well  Patient left in chair;with call bell/phone within reach;with family/visitor present  Nurse Communication Mobility status  PT - Assessment/Plan  Comments on Treatment Session Pt tolerating ther ex and ambulation well. Patient c/o nausea/vomitting. patient motivated and should be safe for d/c home with family tomorrow.  PT Plan Discharge plan remains appropriate;Frequency remains appropriate  PT Frequency 7X/week  Follow Up  Recommendations Home health PT;Supervision/Assistance - 24 hour  Equipment Recommended None recommended by PT  Acute Rehab PT Goals  Time For Goal Achievement 08/12/11  PT Goal: Sit to Stand - Progress Progressing toward goal  PT Goal: Ambulate - Progress Progressing toward goal  PT Goal: Perform Home Exercise Program - Progress Progressing toward goal  PT General Charges  $$ ACUTE PT VISIT 1 Procedure  PT Treatments  $Gait Training 8-22 mins    Pain: 6/10 R knee pain  Lewis Shock, PT, DPT Pager #: [phone] Office #: [phone]

## 2011-08-05 NOTE — Progress Notes (Signed)
Subjective: Doing well.  Pain controlled.     Objective: Vital signs in last 24 hours: Temp:  [97.8 F (36.6 C)-99 F (37.2 C)] 99 F (37.2 C) (07/18 0536) Pulse Rate:  [71-84] 78  (07/18 0536) Resp:  [13-18] 17  (07/18 0800) BP: (119-139)/(64-79) 122/66 mmHg (07/18 0536) SpO2:  [97 %-100 %] 97 % (07/18 0800) Weight:  [90.6 kg (199 lb 11.8 oz)] 90.6 kg (199 lb 11.8 oz) (07/17 1100)  Intake/Output from previous day: 07/17 0701 - 07/18 0700 In: 3100 [I.V.:3000; IV Piggyback:100] Out: 3600 [Urine:3500; Drains:50; Blood:50] Intake/Output this shift:     Basename 08/05/11 0645  HGB 10.3*    Basename 08/05/11 0645  WBC 8.1  RBC 3.63*  HCT 29.9*  PLT 159    Basename 08/05/11 0645  NA 137  K 3.6  CL 103  CO2 25  BUN 6  CREATININE 0.60  GLUCOSE 133*  CALCIUM 8.4    Basename 08/05/11 0645  LABPT --  INR 1.23    Exam:  Dressing c/d/i.  Calf nt, nvi. Assessment/Plan: Anticipate d/c home fri or sat.  D/c pca, foley.   OWENS,JAMES M 08/05/2011, 10:44 AM

## 2011-08-05 NOTE — Evaluation (Signed)
Physical Therapy Evaluation Patient Details Name: Veronica Morton MRN: [id number] DOB: 1960/06/28 Today's Date: 08/05/2011 Time: 7829-5621 PT Time Calculation (min): 28 min  PT Assessment / Plan / Recommendation Clinical Impression  Pt s/p R TKA presenting with increased R knee pain, decreased R LE strength and R knee ROM. patient with good home set up and support. Anticpate paitent to be safe for d/c home tomorrow with family, HHPT, and recommended DME.    PT Assessment  Patient needs continued PT services    Follow Up Recommendations  Home health PT;Supervision/Assistance - 24 hour    Barriers to Discharge None      Equipment Recommendations  None recommended by PT (equip already delivered)    Recommendations for Other Services     Frequency 7X/week    Precautions / Restrictions Precautions Precautions: Knee Required Braces or Orthoses: Knee Immobilizer - Right Knee Immobilizer - Right: On when out of bed or walking Restrictions Weight Bearing Restrictions: Yes RLE Weight Bearing: Weight bearing as tolerated   Pertinent Vitals/Pain 5/10 R knee pain post PT session. Pt pressed PCA pump      Mobility  Bed Mobility Bed Mobility: Supine to Sit Supine to Sit: 4: Min assist Details for Bed Mobility Assistance: v/c's for long sit technique, pt able to manage R LE I'ly Transfers Transfers: Sit to Stand;Stand to Sit Sit to Stand: 4: Min guard;With upper extremity assist;From bed (up to RW) Stand to Sit: 4: Min guard;With armrests;With upper extremity assist;To chair/3-in-1 Details for Transfer Assistance: v/c's for hand placement and walker management Ambulation/Gait Ambulation/Gait Assistance: 4: Min assist Ambulation Distance (Feet): 50 Feet Assistive device: Rolling walker Ambulation/Gait Assistance Details: v/c's for walker sequencing, v/c's for R quad set in stance phase Gait Pattern: Step-to pattern;Decreased step length - right;Decreased stance time -  right;Antalgic Gait velocity: slow General Gait Details: c/o per patient is "I'm so hot." Stairs: No    Exercises Total Joint Exercises Ankle Circles/Pumps: AROM;10 reps;Supine;Both Quad Sets: AROM;Right;10 reps;Supine Heel Slides: AROM;10 reps;Supine;AAROM;PROM;Right   PT Diagnosis: Difficulty walking;Acute pain  PT Problem List: Decreased strength;Decreased range of motion;Decreased activity tolerance;Decreased mobility PT Treatment Interventions: DME instruction;Gait training;Stair training;Functional mobility training;Therapeutic activities;Therapeutic exercise   PT Goals Acute Rehab PT Goals PT Goal Formulation: With patient Time For Goal Achievement: 08/12/11 Potential to Achieve Goals: Good Pt will go Supine/Side to Sit: with modified independence;with HOB 0 degrees PT Goal: Supine/Side to Sit - Progress: Goal set today Pt will go Sit to Supine/Side: with modified independence;with HOB 0 degrees PT Goal: Sit to Supine/Side - Progress: Goal set today Pt will go Sit to Stand: with modified independence;with upper extremity assist (up to RW.) PT Goal: Sit to Stand - Progress: Goal set today Pt will Ambulate: >150 feet;with modified independence;with rolling walker PT Goal: Ambulate - Progress: Goal set today Pt will Perform Home Exercise Program: Independently PT Goal: Perform Home Exercise Program - Progress: Goal set today  Visit Information  Last PT Received On: 08/05/11 Assistance Needed: +1    Subjective Data  Subjective: Pt received supine in bed with report of 2/10 R knee pain.   Prior Functioning  Home Living Lives With: Family Available Help at Discharge: Family;Available 24 hours/day Type of Home: House Home Access: Ramped entrance Home Layout: Able to live on main level with bedroom/bathroom Bathroom Shower/Tub: Tub/shower unit;Walk-in shower Bathroom Toilet: Standard Bathroom Accessibility: Yes How Accessible: Accessible via walker Home Adaptive  Equipment: Bedside commode/3-in-1;Walker - rolling;Shower chair with back Prior Function Level of Independence:  Independent Able to Take Stairs?: Yes Driving: Yes Vocation: Full time employment Communication Communication: No difficulties Dominant Hand: Right    Cognition  Overall Cognitive Status: Appears within functional limits for tasks assessed/performed Arousal/Alertness: Awake/alert Orientation Level: Appears intact for tasks assessed Behavior During Session: [facility] for tasks performed    Extremity/Trunk Assessment Right Upper Extremity Assessment RUE ROM/Strength/Tone: Within functional levels Left Upper Extremity Assessment LUE ROM/Strength/Tone: WFL for tasks assessed Right Lower Extremity Assessment RLE ROM/Strength/Tone: Deficits;Due to pain RLE ROM/Strength/Tone Deficits: initiate quad set, limited R knee ROM due to pain Left Lower Extremity Assessment LLE ROM/Strength/Tone: Within functional levels Trunk Assessment Trunk Assessment: Normal   Balance    End of Session PT - End of Session Equipment Utilized During Treatment: Gait belt;Right knee immobilizer Activity Tolerance: Patient tolerated treatment well Patient left: in chair;with call bell/phone within reach Nurse Communication: Mobility status  GP     Marcene Brawn 08/05/2011, 9:13 AM  Lewis Shock, PT, DPT Pager #: [phone] Office #: [phone]

## 2011-08-05 NOTE — Care Management Note (Signed)
    Page 1 of 1   08/05/2011     11:03:51 AM   CARE MANAGEMENT NOTE 08/05/2011  Patient:  Veronica Morton, Veronica Morton   Account Number:  [id number]  Date Initiated:  08/05/2011  Documentation initiated by:  Anette Guarneri  Subjective/Objective Assessment:   POD#1 s/pright TKA     Action/Plan:   home with [facility] services   Anticipated DC Date:  08/06/2011   Anticipated DC Plan:  HOME W HOME HEALTH SERVICES      DC Planning Services  CM consult      Choice offered to / List presented to:             Status of service:  Completed, signed off Medicare Important Message given?  NO (If response is "NO", the following Medicare IM given date fields will be blank) Date Medicare IM given:   Date Additional Medicare IM given:    Discharge Disposition:  HOME W HOME HEALTH SERVICES  Per UR Regulation:  Reviewed for med. necessity/level of care/duration of stay  If discussed at Long Length of Stay Meetings, dates discussed:    Comments:  08/05/11  11:02  Anette Guarneri RN/CM [facility] services w/[facility] pre-arranged by MD office prior to admission DME delivered to home by [employer] prior to admission.

## 2011-08-05 NOTE — Progress Notes (Signed)
ANTICOAGULATION CONSULT NOTE - Follow Up Consult  Pharmacy Consult for Warfarin Indication: VTE prophylaxis  No Known Allergies  Patient Measurements: Height: 5\' 2"  (157.5 cm) Weight: 199 lb 11.8 oz (90.6 kg) IBW/kg (Calculated) : 50.1  AdjBW: 66.3 kg  Vital Signs: Temp: 98 F (36.7 C) (07/18 1400) BP: 133/77 mmHg (07/18 1400) Pulse Rate: 71  (07/18 1400)  Labs:  Basename 08/05/11 0645  HGB 10.3*  HCT 29.9*  PLT 159  APTT --  LABPROT 15.8*  INR 1.23  HEPARINUNFRC --  CREATININE 0.60  CKTOTAL --  CKMB --  TROPONINI --   INR 1.23  Estimated Creatinine Clearance: 90 ml/min (by C-G formula based on Cr of 0.6).   Medications:  Scheduled:          . docusate sodium  100 mg Oral BID  . enoxaparin (LOVENOX) injection  30 mg Subcutaneous Q12H  . HYDROmorphone      . morphine      . oxyCODONE-acetaminophen      . warfarin  7.5 mg Oral ONCE-1800  . warfarin   Does not apply Once  . Warfarin - Pharmacist Dosing Inpatient   Does not apply q1800  . DISCONTD: morphine   Intravenous Q4H    Assessment: 50 YOF s/p TKA on 7/17. CBC is lower than baseline, suspect acute blood loss. Will continue to monitor. INR is currently sub-therapeutic at 1.23.    Goal of Therapy:  INR 2-3 Monitor platelets by anticoagulation protocol: Yes   Plan:  1. Warfarin 7.5mg  x1 @ 1800 2. Daily PT/INR 3. Follow CBC 4. Lovenox to be stopped when INR is 1.8 5. Will educate patient on warfarin.  Alaynna Kerwood D. Fredi Hurtado, PharmD Clinical Pharmacist Pager: [phone] Phone: [phone] 08/05/2011 2:51 PM

## 2011-08-05 NOTE — Progress Notes (Signed)
Referral received for SNF. Chart reviewed and CSW has spoken with RNCM who indicates that patient is for DC to home with Home Health and DME. Reviewed Physical Therapy notes and above recommendations are in place. CSW to sign off. Please re-consult if CSW needs arise.  Lorri Frederick. [place]  [phone]

## 2011-08-05 NOTE — Op Note (Signed)
NAMESNOW, [place]               ACCOUNT NO.:  [id number]  MEDICAL RECORD NO.:  [id number]  LOCATION:  5N24C                        FACILITY:  [facility]  PHYSICIAN:  Loreta Ave, M.D. DATE OF BIRTH:  1960-01-29  DATE OF PROCEDURE:  08/04/2011 DATE OF DISCHARGE:                              OPERATIVE REPORT   PREOPERATIVE DIAGNOSIS:  Right knee end-stage degenerative arthritis, varus alignment.  POSTOPERATIVE DIAGNOSIS:  Right knee end-stage degenerative arthritis, varus alignment.  PROCEDURE:  Right knee modified minimally invasive total knee replacement with Stryker triathlon prosthesis.  Soft tissue balancing. Cemented pegged posterior stabilized #4 femoral component.  Cemented #4 tibial component and 9 mm polyethylene insert.  Cemented resurfacing medial offset 35-mm patellar component.  SURGEON:  Loreta Ave, M.D.  ASSISTANT:  Genene Churn. Barry Dienes, PA present throughout the entire case, necessary for timely completion of procedure.  ANESTHESIA:  General.  BLOOD LOSS:  Minimal.  SPECIMENS:  None.  CULTURES:  None.  COMPLICATIONS:  None.  DRESSINGS:  Soft compressive knee immobilizer.  DRAINS:  Hemovac x1.  DESCRIPTION OF PROCEDURE:  The patient was brought to the operating room, placed on the operating table in a supine position.  After adequate anesthesia had been obtained, knee examined.  Varus alignment, which was correctable.  Motion 0-100 degrees.  Tourniquet applied. Prepped and draped in usual sterile fashion.  Exsanguinated with elevation Esmarch.  Tourniquet inflated to 350 mmHg.  Straight incision above the patella down to tibial tubercle.  Hemostasis with cautery. Medial arthrotomy, vastus splitting, preserving quad tendon.  Knee exposed.  Medial capsule release.  Remnants of menisci, loose body, cruciate ligaments removed.  Distal femur exposed.  Intramedullary guide placed.  8 mm resection, 5 degrees of valgus.  Using epicondylar axis, the femur  was sized, cut, and fitted for a posterior stabilized #4 component.  Proximal tibial resection extramedullary guide 3 degree posterior slope cut.  Sized to #4 component.  Patella exposed, posterior 10 mm removed.  Drilled, sized, and fitted for a 35-mm component. Debris cleared throughout the knee in flexion/extension.  Trials put in place, #4 above and below, 9 mm insert, 35 patella.  Tibia was marked for rotation.  With this construct, excellent flexion/extension, nicely balanced knee, good mechanical axis, and good balancing in flexion/extension.  All trials have been removed.  Copious irrigation with pulse irrigating device.  Cement prepared and placed on all components, firmly seated.  Polyethylene attached to tibia, knee reduced.  Patella held with a clamp.  Once cement hardened, the knee was reexamined.  Again, pleased with alignment, stability, and motion. Hemovac was placed and brought through a separate stab wound. Arthrotomy closed with #1 Vicryl.  Skin and subcutaneous tissue with Vicryl and staples.  Sterile compressive dressing applied.  Tourniquet deflated and removed.  Knee immobilizer applied.  Anesthesia reversed. Brought to the recovery room.  Tolerated surgery well.  No complications.     Loreta Ave, M.D.     DFM/MEDQ  D:  08/04/2011  T:  08/05/2011  Job:  [id number]

## 2011-08-06 LAB — BASIC METABOLIC PANEL
CO2: 28 mEq/L (ref 19–32)
Calcium: 8.5 mg/dL (ref 8.4–10.5)
Creatinine, Ser: 0.65 mg/dL (ref 0.50–1.10)
Glucose, Bld: 108 mg/dL — ABNORMAL HIGH (ref 70–99)
Sodium: 138 mEq/L (ref 135–145)

## 2011-08-06 LAB — CBC
HCT: 29.1 % — ABNORMAL LOW (ref 36.0–46.0)
Hemoglobin: 9.7 g/dL — ABNORMAL LOW (ref 12.0–15.0)
RBC: 3.52 MIL/uL — ABNORMAL LOW (ref 3.87–5.11)
WBC: 8 10*3/uL (ref 4.0–10.5)

## 2011-08-06 LAB — PROTIME-INR: INR: 1.74 — ABNORMAL HIGH (ref 0.00–1.49)

## 2011-08-06 MED ORDER — ENOXAPARIN SODIUM 30 MG/0.3ML ~~LOC~~ SOLN: 30.0000 mg | Freq: Two times a day (BID) | SUBCUTANEOUS | Status: DC

## 2011-08-06 MED ORDER — WARFARIN SODIUM 5 MG PO TABS: 5.0000 mg | ORAL_TABLET | Freq: Every day | ORAL | Status: DC

## 2011-08-06 MED ORDER — METHOCARBAMOL 500 MG PO TABS: 500.0000 mg | ORAL_TABLET | Freq: Four times a day (QID) | ORAL | Status: AC | PRN

## 2011-08-06 MED ORDER — OXYCODONE-ACETAMINOPHEN 10-325 MG PO TABS: 1.0000 | ORAL_TABLET | ORAL | Status: AC | PRN

## 2011-08-06 NOTE — Progress Notes (Signed)
Subjective: Doing well.  Pain controlled.  Did well with therapy.  Wants to go home.  No bm yet.     Objective: Vital signs in last 24 hours: Temp:  [98 F (36.7 C)-100.7 F (38.2 C)] 99.5 F (37.5 C) (07/19 0610) Pulse Rate:  [71-95] 92  (07/19 0610) Resp:  [16-18] 18  (07/19 0610) BP: (133-140)/(74-79) 135/79 mmHg (07/19 0610) SpO2:  [97 %-100 %] 97 % (07/19 0610)  Intake/Output from previous day: 07/18 0701 - 07/19 0700 In: 955 [P.O.:480; I.V.:450] Out: -  Intake/Output this shift:     Basename 08/06/11 0525 08/05/11 0645  HGB 9.7* 10.3*    Basename 08/06/11 0525 08/05/11 0645  WBC 8.0 8.1  RBC 3.52* 3.63*  HCT 29.1* 29.9*  PLT 163 159    Basename 08/06/11 0525 08/05/11 0645  NA 138 137  K 3.3* 3.6  CL 102 103  CO2 28 25  BUN 6 6  CREATININE 0.65 0.60  GLUCOSE 108* 133*  CALCIUM 8.5 8.4    Basename 08/06/11 0525 08/05/11 0645  LABPT -- --  INR 1.74* 1.23    Exam:  Wound looks good.  Staples intact.  No signs of infection.  Drain removed.    Assessment/Plan: D/c home today.  D/c iv.     Hayleen Clinkscales M 08/06/2011, 11:28 AM

## 2011-08-06 NOTE — Progress Notes (Signed)
Physical Therapy Treatment Note   08/06/11 0804  PT Visit Information  Last PT Received On 08/06/11  Assistance Needed +1  PT Time Calculation  PT Start Time 0804  PT Stop Time 0833  PT Time Calculation (min) 29 min  Subjective Data  Subjective Pt received supine in bed with report of "I feel much better today."  Precautions  Precautions Knee  Restrictions  RLE Weight Bearing WBAT  Cognition  Overall Cognitive Status Appears within functional limits for tasks assessed/performed  Bed Mobility  Bed Mobility Supine to Sit  Supine to Sit 5: Supervision;HOB flat  Details for Bed Mobility Assistance pt with independent R LE management  Transfers  Transfers Sit to Stand;Stand to Sit  Sit to Stand 5: Supervision;With upper extremity assist;From bed  Stand to Sit 4: Min guard;With armrests;To chair/3-in-1  Details for Transfer Assistance v/c's for hand placement  Ambulation/Gait  Ambulation/Gait Assistance 4: Min guard  Ambulation Distance (Feet) 100 Feet  Assistive device Rolling walker  Ambulation/Gait Assistance Details v/c's for walker sequencing, pt with improved R LE WBing tolerance  Gait Pattern Step-to pattern;Decreased step length - right;Decreased stance time - right;Antalgic  Stairs No  Exercises  Exercises Total Joint  Total Joint Exercises  Quad Sets AROM;Right;10 reps;Supine  Short Arc Quad AROM;Right;10 reps  Long Arc Quad AROM;Right;10 reps;Seated (less than 1/2 the range)  Knee Flexion 10 reps;AAROM;Right (taught self assist R knee ROM)  PT - End of Session  Equipment Utilized During Treatment Gait belt  Activity Tolerance Patient tolerated treatment well  Patient left in chair;with call bell/phone within reach  Nurse Communication Mobility status  PT - Assessment/Plan  Comments on Treatment Session Pt progressing well towards all goals. Patient safe for d/c home today with family support, recommended DME, and HHPT.  PT Plan Discharge plan remains  appropriate;Frequency remains appropriate  PT Frequency 7X/week  Follow Up Recommendations Home health PT;Supervision/Assistance - 24 hour  Equipment Recommended None recommended by PT  Acute Rehab PT Goals  Time For Goal Achievement 08/12/11  PT Goal: Supine/Side to Sit - Progress Progressing toward goal  PT Goal: Sit to Stand - Progress Progressing toward goal  PT Goal: Ambulate - Progress Progressing toward goal  PT Goal: Perform Home Exercise Program - Progress Met  PT General Charges  $$ ACUTE PT VISIT 1 Procedure  PT Treatments  $Gait Training 8-22 mins  $Therapeutic Exercise 8-22 mins    Pain: pt did not rate but reports increased pain with R knee flex  Lewis Shock, PT, DPT Pager #: [phone] Office #: [phone]

## 2011-08-06 NOTE — Progress Notes (Signed)
Physical Therapy Treatment Note   08/06/11 1420  PT Visit Information  Last PT Received On 08/06/11  Assistance Needed +1  PT Time Calculation  PT Start Time 1420  PT Stop Time 1440  PT Time Calculation (min) 20 min  Subjective Data  Subjective Pt received supine in bed agreeable to PT.  Precautions  Precautions Knee  Restrictions  RLE Weight Bearing WBAT  Cognition  Overall Cognitive Status Appears within functional limits for tasks assessed/performed  Bed Mobility  Bed Mobility Supine to Sit  Supine to Sit 6: Modified independent (Device/Increase time);HOB flat  Transfers  Transfers Sit to Stand;Stand to Sit  Sit to Stand 5: Supervision;With upper extremity assist;From bed  Stand to Sit 4: Min guard  Details for Transfer Assistance v/c's for hand placement  Ambulation/Gait  Ambulation/Gait Assistance 4: Min guard  Ambulation Distance (Feet) 100 Feet  Assistive device Rolling walker  Ambulation/Gait Assistance Details improved R LE WBing  Gait Pattern Step-to pattern;Decreased step length - right;Decreased stance time - right;Antalgic  Stairs Yes  Stairs Assistance 4: Min assist  Stairs Assistance Details (indicate cue type and reason) v/c's for technique  Stair Management Technique No rails;Backwards;With walker (on platform step)  Number of Stairs 2   PT - End of Session  Equipment Utilized During Treatment Gait belt  Activity Tolerance Patient tolerated treatment well  Patient left (sittin EOB with family present)  PT - Assessment/Plan  Comments on Treatment Session Pt safe for d/c today.  PT Plan Discharge plan remains appropriate;Frequency remains appropriate  PT Frequency 7X/week  Follow Up Recommendations Home health PT;Supervision/Assistance - 24 hour  Acute Rehab PT Goals  Time For Goal Achievement 08/12/11  PT Goal: Supine/Side to Sit - Progress Met  PT Goal: Ambulate - Progress Progressing toward goal  PT Goal: Perform Home Exercise Program - Progress  Met  PT General Charges  $$ ACUTE PT VISIT 1 Procedure  PT Treatments  $Gait Training 8-22 mins     Lewis Shock, PT, DPT Pager #: [phone] Office #: [phone]

## 2011-08-11 NOTE — Discharge Summary (Signed)
  ABBREVIATED DISCHARGE SUMMARY      DATE OF HOSPITALIZATION:  04 August 2011  REASON FOR HOSPITALIZATION:  51 yo wf with hx end stage djd right knee and chronic pain.    SIGNIFICANT FINDINGS:  DJD  OPERATION:  Right total  Knee replacement  FINAL DIAGNOSIS:  same  SECONDARY DIAGNOSIS: none  CONSULTANTS:  none  DISCHARGE CONDITION:  STABLE  DISCHARGED TO:  HOME

## 2011-12-29 ENCOUNTER — Ambulatory Visit: Payer: Self-pay | Admitting: Family Medicine

## 2012-11-23 ENCOUNTER — Encounter: Payer: Self-pay | Admitting: Internal Medicine

## 2012-11-29 ENCOUNTER — Ambulatory Visit: Payer: Self-pay | Admitting: Family Medicine

## 2012-12-07 LAB — HEMOGLOBIN A1C: Hgb A1c MFr Bld: 5.8 % (ref 4.0–6.0)

## 2012-12-28 DIAGNOSIS — Z8701 Personal history of pneumonia (recurrent): Secondary | ICD-10-CM | POA: Insufficient documentation

## 2013-01-04 ENCOUNTER — Ambulatory Visit (AMBULATORY_SURGERY_CENTER): Payer: 59

## 2013-01-04 VITALS — Ht 62.0 in | Wt 207.0 lb

## 2013-01-04 DIAGNOSIS — Z800 Family history of malignant neoplasm of digestive organs: Secondary | ICD-10-CM

## 2013-01-04 MED ORDER — MOVIPREP 100 G PO SOLR: 1.0000 | Freq: Once | ORAL | Status: DC

## 2013-01-05 ENCOUNTER — Encounter: Payer: Self-pay | Admitting: Internal Medicine

## 2013-01-22 ENCOUNTER — Ambulatory Visit: Payer: 59 | Admitting: Neurology

## 2013-01-25 ENCOUNTER — Ambulatory Visit (AMBULATORY_SURGERY_CENTER): Payer: 59 | Admitting: Internal Medicine

## 2013-01-25 ENCOUNTER — Encounter: Payer: Self-pay | Admitting: Internal Medicine

## 2013-01-25 VITALS — BP 141/91 | HR 77 | Temp 98.1°F | Resp 31 | Ht 62.0 in | Wt 207.0 lb

## 2013-01-25 DIAGNOSIS — D126 Benign neoplasm of colon, unspecified: Secondary | ICD-10-CM

## 2013-01-25 DIAGNOSIS — Z1211 Encounter for screening for malignant neoplasm of colon: Secondary | ICD-10-CM

## 2013-01-25 DIAGNOSIS — Z800 Family history of malignant neoplasm of digestive organs: Secondary | ICD-10-CM

## 2013-01-25 LAB — HM COLONOSCOPY

## 2013-01-25 MED ORDER — SODIUM CHLORIDE 0.9 % IV SOLN: 500.0000 mL | INTRAVENOUS | Status: DC

## 2013-01-25 NOTE — Progress Notes (Signed)
Lidocaine-40mg IV prior to Propofol InductionPropofol given over incremental dosages 

## 2013-01-25 NOTE — Progress Notes (Signed)
Called to room to assist during endoscopic procedure.  Patient ID and intended procedure confirmed with present staff. Received instructions for my participation in the procedure from the performing physician.  

## 2013-01-25 NOTE — Patient Instructions (Signed)
FOLLOW DISCHARGE INSTRUCTIONS (BLUE AND GREEN SHEETS).YOU HAD AN ENDOSCOPIC PROCEDURE TODAY AT [facility]: Refer to the procedure report that was given to you for any specific questions about what was found during the examination.  If the procedure report does not answer your questions, please call your gastroenterologist to clarify.  If you requested that your care partner not be given the details of your procedure findings, then the procedure report has been included in a sealed envelope for you to review at your convenience later.  YOU SHOULD EXPECT: Some feelings of bloating in the abdomen. Passage of more gas than usual.  Walking can help get rid of the air that was put into your GI tract during the procedure and reduce the bloating. If you had a lower endoscopy (such as a colonoscopy or flexible sigmoidoscopy) you may notice spotting of blood in your stool or on the toilet paper. If you underwent a bowel prep for your procedure, then you may not have a normal bowel movement for a few days.  DIET: Your first meal following the procedure should be a light meal and then it is ok to progress to your normal diet.  A half-sandwich or bowl of soup is an example of a good first meal.  Heavy or fried foods are harder to digest and may make you feel nauseous or bloated.  Likewise meals heavy in dairy and vegetables can cause extra gas to form and this can also increase the bloating.  Drink plenty of fluids but you should avoid alcoholic beverages for 24 hours.  ACTIVITY: Your care partner should take you home directly after the procedure.  You should plan to take it easy, moving slowly for the rest of the day.  You can resume normal activity the day after the procedure however you should NOT DRIVE or use heavy machinery for 24 hours (because of the sedation medicines used during the test).    SYMPTOMS TO REPORT IMMEDIATELY: A gastroenterologist can be reached at any hour.  During normal  business hours, 8:30 AM to 5:00 PM Monday through Friday, call [phone].  After hours and on weekends, please call the GI answering service at [phone] who will take a message and have the physician on call contact you.   Following lower endoscopy (colonoscopy or flexible sigmoidoscopy):  Excessive amounts of blood in the stool  Significant tenderness or worsening of abdominal pains  Swelling of the abdomen that is new, acute  Fever of 100F or higher  FOLLOW UP: If any biopsies were taken you will be contacted by phone or by letter within the next 1-3 weeks.  Call your gastroenterologist if you have not heard about the biopsies in 3 weeks.  Our staff will call the home number listed on your records the next business day following your procedure to check on you and address any questions or concerns that you may have at that time regarding the information given to you following your procedure. This is a courtesy call and so if there is no answer at the home number and we have not heard from you through the emergency physician on call, we will assume that you have returned to your regular daily activities without incident.  SIGNATURES/CONFIDENTIALITY: You and/or your care partner have signed paperwork which will be entered into your electronic medical record.  These signatures attest to the fact that that the information above on your After Visit Summary has been reviewed and is understood.  Full  responsibility of the confidentiality of this discharge information lies with you and/or your care-partner.  Polyp information given. Dr. Henrene Pastor will advise you about next colonoscopy after he reviews your pathology results.

## 2013-01-25 NOTE — Op Note (Signed)
[place]  [address] [employer] [address]. [place], [address]   COLONOSCOPY PROCEDURE REPORT  PATIENT: Veronica Morton, Veronica Morton  MR#: [id number] BIRTHDATE: [birth year] , 52  yrs. old GENDER: Female ENDOSCOPIST: Eustace Quail, MD REFERRED JT:[id number] Sowles, M.D. PROCEDURE DATE:  01/25/2013 PROCEDURE:   Colonoscopy with snare polypectomy x 1 First Screening Colonoscopy - Avg.  risk and is 50 yrs.  old or older - No.  Prior Negative Screening - Now for repeat screening. N/A  History of Adenoma - Now for follow-up colonoscopy & has been > or = to 3 yrs.  N/A  Polyps Removed Today? Yes. ASA CLASS:   Class II INDICATIONS:Patient's immediate family history of colon cancer. Father w/ CRC at 14 MEDICATIONS: MAC sedation, administered by CRNA and propofol (Diprivan) 300mg  IV  DESCRIPTION OF PROCEDURE:   After the risks benefits and alternatives of the procedure were thoroughly explained, informed consent was obtained.  A digital rectal exam revealed no abnormalities of the rectum.   The LB [id number]  endoscope was introduced through the anus and advanced to the cecum, which was identified by both the appendix and ileocecal valve. No adverse events experienced.   The quality of the prep was excellent, using MoviPrep  The instrument was then slowly withdrawn as the colon was fully examined.   COLON FINDINGS: A diminutive polyp was found in the rectum.  A polypectomy was performed with a cold snare.  The resection was complete and the polyp tissue was completely retrieved.   The colon was otherwise normal.  There was no diverticulosis, inflammation, other polyps or cancers .  Retroflexed views revealed no abnormalities. The time to cecum=2 minutes 28 seconds.  Withdrawal time=10 minutes 54 seconds.  The scope was withdrawn and the procedure completed.  COMPLICATIONS: There were no complications.  ENDOSCOPIC IMPRESSION: 1.   Diminutive polyp was found in the rectum; polypectomy  was performed with a cold snare 2.   The colon was otherwise normal  RECOMMENDATIONS: 1. Repeat colonoscopy in 5 years if polyp adenomatous; otherwise 10 years   eSigned:  Eustace Quail, MD 01/25/2013 11:12 AM   cc: The Patient    ; Sherley B[facility], [place])

## 2013-01-26 ENCOUNTER — Telehealth: Payer: Self-pay

## 2013-01-26 NOTE — Telephone Encounter (Signed)
  Follow up Call-  Call back number 01/25/2013  Post procedure Call Back phone  # [phone]  Permission to leave phone message Yes     Patient questions:  Do you have a fever, pain , or abdominal swelling? no Pain Score  0 *  Have you tolerated food without any problems? yes  Have you been able to return to your normal activities? yes  Do you have any questions about your discharge instructions: Diet   no Medications  no Follow up visit  no  Do you have questions or concerns about your Care? no  Actions: * If pain score is 4 or above: No action needed, pain <4.

## 2013-01-31 ENCOUNTER — Encounter: Payer: Self-pay | Admitting: Internal Medicine

## 2013-02-27 ENCOUNTER — Other Ambulatory Visit: Payer: Self-pay | Admitting: Physician Assistant

## 2013-02-27 ENCOUNTER — Encounter (HOSPITAL_COMMUNITY): Payer: Self-pay | Admitting: Pharmacy Technician

## 2013-02-27 NOTE — H&P (Signed)
TOTAL KNEE ADMISSION H&P  Patient is being admitted for left total knee arthroplasty.  Subjective:  Chief Complaint:left knee pain.  HPI: Veronica Morton, 53 y.o. female, has a history of pain and functional disability in the left knee due to arthritis and has failed non-surgical conservative treatments for greater than 12 weeks to includeNSAID's and/or analgesics, viscosupplementation injections and activity modification.  Onset of symptoms was gradual, starting 3 years ago with gradually worsening course since that time. The patient noted no past surgery on the left knee(s).  Patient currently rates pain in the left knee(s) at 6 out of 10 with activity. Patient has night pain, worsening of pain with activity and weight bearing, pain that interferes with activities of daily living, pain with passive range of motion, crepitus and joint swelling.  Patient has evidence of periarticular osteophytes and joint space narrowing by imaging studies. There is no active infection.  There are no active problems to display for this patient.  Past Medical History  Diagnosis Date  . Murmur     Past Surgical History  Procedure Laterality Date  . Tubal ligation    . Appendectomy    . Total knee arthroplasty  08/04/2011    Procedure: TOTAL KNEE ARTHROPLASTY;  Surgeon: Ninetta Lights, MD;  Location: [facility];  Service: Orthopedics;  Laterality: Right;     (Not in a hospital admission) No Known Allergies  History  Substance Use Topics  . Smoking status: Never Smoker   . Smokeless tobacco: Never Used  . Alcohol Use: Yes     Comment: occ    Family History  Problem Relation Age of Onset  . Rectal cancer Father   . Colon cancer Paternal Grandmother      Review of Systems  Constitutional: Negative.   HENT: Negative.   Eyes: Negative.   Respiratory: Negative.   Cardiovascular: Negative.   Gastrointestinal: Negative.   Genitourinary: Negative.   Musculoskeletal: Positive for joint pain.  Skin:  Negative.   Neurological: Negative.   Endo/Heme/Allergies: Negative.   Psychiatric/Behavioral: Negative.     Objective:  Physical Exam  Constitutional: She is oriented to person, place, and time. She appears well-developed and well-nourished.  HENT:  Head: Normocephalic and atraumatic.  Eyes: EOM are normal. Pupils are equal, round, and reactive to light.  Neck: Normal range of motion. Neck supple.  Cardiovascular: Normal rate and regular rhythm.   Murmur heard. Respiratory: Effort normal and breath sounds normal. No respiratory distress. She has no wheezes. She has no rales.  GI: Soft. Bowel sounds are normal. She exhibits no distension. There is no tenderness.  Musculoskeletal:  ROM from 5-120 degrees.  Positive patellofemoral crepitus.  Ligaments stable.  Negative straight leg raise, negative log roll.  Some varus alignment.  Neurovascularly intact distally.  Neurological: She is alert and oriented to person, place, and time.  Skin: Skin is warm and dry.  Psychiatric: She has a normal mood and affect. Her behavior is normal. Judgment and thought content normal.    Vital signs in last 24 hours: @VSRANGES @  Labs:   Estimated body mass index is 37.85 kg/(m^2) as calculated from the following:   Height as of 01/25/13: 5\' 2"  (1.575 m).   Weight as of 01/25/13: 93.895 kg (207 lb).   Imaging Review Plain radiographs demonstrate severe degenerative joint disease of the left knee(s). The overall alignment ismild varus. The bone quality appears to be fair for age and reported activity level.  Assessment/Plan:  End stage arthritis, left  knee   The patient history, physical examination, clinical judgment of the provider and imaging studies are consistent with end stage degenerative joint disease of the left knee(s) and total knee arthroplasty is deemed medically necessary. The treatment options including medical management, injection therapy arthroscopy and arthroplasty were discussed  at length. The risks and benefits of total knee arthroplasty were presented and reviewed. The risks due to aseptic loosening, infection, stiffness, patella tracking problems, thromboembolic complications and other imponderables were discussed. The patient acknowledged the explanation, agreed to proceed with the plan and consent was signed. Patient is being admitted for inpatient treatment for surgery, pain control, PT, OT, prophylactic antibiotics, VTE prophylaxis, progressive ambulation and ADL's and discharge planning. The patient is planning to be discharged home with home health services

## 2013-03-01 ENCOUNTER — Encounter (HOSPITAL_COMMUNITY)
Admission: RE | Admit: 2013-03-01 | Discharge: 2013-03-01 | Disposition: A | Discharge status: Home / Self Care | Payer: 59 | Source: Ambulatory Visit | Attending: Orthopedic Surgery | Admitting: Orthopedic Surgery

## 2013-03-01 ENCOUNTER — Ambulatory Visit (HOSPITAL_COMMUNITY)
Admission: RE | Admit: 2013-03-01 | Discharge: 2013-03-01 | Disposition: A | Discharge status: Home / Self Care | Payer: 59 | Source: Ambulatory Visit | Attending: Physician Assistant | Admitting: Physician Assistant

## 2013-03-01 ENCOUNTER — Encounter (HOSPITAL_COMMUNITY): Payer: Self-pay

## 2013-03-01 DIAGNOSIS — M1990 Unspecified osteoarthritis, unspecified site: Secondary | ICD-10-CM | POA: Insufficient documentation

## 2013-03-01 LAB — URINALYSIS, ROUTINE W REFLEX MICROSCOPIC
Bilirubin Urine: NEGATIVE
Glucose, UA: NEGATIVE mg/dL
Hgb urine dipstick: NEGATIVE
Ketones, ur: NEGATIVE mg/dL
NITRITE: NEGATIVE
PROTEIN: NEGATIVE mg/dL
Specific Gravity, Urine: 1.025 (ref 1.005–1.030)
UROBILINOGEN UA: 1 mg/dL (ref 0.0–1.0)
pH: 7 (ref 5.0–8.0)

## 2013-03-01 LAB — TYPE AND SCREEN
ABO/RH(D): A POS
ANTIBODY SCREEN: NEGATIVE

## 2013-03-01 LAB — URINE MICROSCOPIC-ADD ON

## 2013-03-01 LAB — COMPREHENSIVE METABOLIC PANEL
ALT: 23 U/L (ref 0–35)
AST: 22 U/L (ref 0–37)
Albumin: 4 g/dL (ref 3.5–5.2)
Alkaline Phosphatase: 73 U/L (ref 39–117)
BUN: 7 mg/dL (ref 6–23)
CALCIUM: 9 mg/dL (ref 8.4–10.5)
CO2: 27 mEq/L (ref 19–32)
CREATININE: 0.64 mg/dL (ref 0.50–1.10)
Chloride: 105 mEq/L (ref 96–112)
GFR calc Af Amer: >90 mL/min (ref >90)
GFR calc non Af Amer: >90 mL/min (ref >90)
Glucose, Bld: 97 mg/dL (ref 70–99)
Potassium: 3.7 mEq/L (ref 3.7–5.3)
Sodium: 143 mEq/L (ref 137–147)
TOTAL PROTEIN: 7.3 g/dL (ref 6.0–8.3)
Total Bilirubin: 1 mg/dL (ref 0.3–1.2)

## 2013-03-01 LAB — CBC WITH DIFFERENTIAL/PLATELET
BASOS ABS: 0 10*3/uL (ref 0.0–0.1)
Basophils Relative: 0 % (ref 0–1)
Eosinophils Absolute: 0.2 10*3/uL (ref 0.0–0.7)
Eosinophils Relative: 3 % (ref 0–5)
HEMATOCRIT: 35.8 % — AB (ref 36.0–46.0)
Hemoglobin: 11.9 g/dL — ABNORMAL LOW (ref 12.0–15.0)
Lymphocytes Relative: 24 % (ref 12–46)
Lymphs Abs: 1.7 10*3/uL (ref 0.7–4.0)
MCH: 25.8 pg — ABNORMAL LOW (ref 26.0–34.0)
MCHC: 33.2 g/dL (ref 30.0–36.0)
MCV: 77.5 fL — AB (ref 78.0–100.0)
MONO ABS: 0.5 10*3/uL (ref 0.1–1.0)
Monocytes Relative: 8 % (ref 3–12)
NEUTROS ABS: 4.5 10*3/uL (ref 1.7–7.7)
Neutrophils Relative %: 65 % (ref 43–77)
PLATELETS: 230 10*3/uL (ref 150–400)
RBC: 4.62 MIL/uL (ref 3.87–5.11)
RDW: 13.9 % (ref 11.5–15.5)
WBC: 6.9 10*3/uL (ref 4.0–10.5)

## 2013-03-01 LAB — HCG, SERUM, QUALITATIVE: Preg, Serum: NEGATIVE

## 2013-03-01 LAB — SURGICAL PCR SCREEN
MRSA, PCR: NEGATIVE
STAPHYLOCOCCUS AUREUS: NEGATIVE

## 2013-03-01 LAB — PROTIME-INR
INR: 0.99 (ref 0.00–1.49)
Prothrombin Time: 12.9 seconds (ref 11.6–15.2)

## 2013-03-01 LAB — APTT: APTT: 36 s (ref 24–37)

## 2013-03-01 MED ORDER — CHLORHEXIDINE GLUCONATE 4 % EX LIQD: 60.0000 mL | Freq: Once | CUTANEOUS | Status: DC

## 2013-03-01 NOTE — Pre-Procedure Instructions (Signed)
Veronica Morton  03/01/2013   Your procedure is scheduled on:  03/07/13  Report to [facility]  2 * 3 at 1 pm  Call this number if you have problems the morning of surgery: [phone]   Remember:   Do not eat food or drink liquids after midnight.   Take these medicines the morning of surgery with A SIP OF WATER: none   Do not wear jewelry, make-up or nail polish.  Do not wear lotions, powders, or perfumes. You may wear deodorant.  Do not shave 48 hours prior to surgery. Men may shave face and neck.  Do not bring valuables to the hospital.  [facility] is not responsible                  for any belongings or valuables.               Contacts, dentures or bridgework may not be worn into surgery.  Leave suitcase in the car. After surgery it may be brought to your room.  For patients admitted to the hospital, discharge time is determined by your                treatment team.               Patients discharged the day of surgery will not be allowed to drive  home.  Name and phone number of your driver: family  Special Instructions: Incentive Spirometry - Practice and bring it with you on the day of surgery.   Please read over the following fact sheets that you were given: Pain Booklet, Coughing and Deep Breathing, Blood Transfusion Information, MRSA Information and Surgical Site Infection Prevention

## 2013-03-02 LAB — URINE CULTURE
Colony Count: NO GROWTH
Culture: NO GROWTH

## 2013-03-06 MED ORDER — CHLORHEXIDINE GLUCONATE 4 % EX LIQD
60.0000 mL | Freq: Once | CUTANEOUS | Status: DC
  Filled 2013-03-06: qty 60

## 2013-03-06 MED ORDER — LACTATED RINGERS IV SOLN
INTRAVENOUS | Status: DC
  Administered 2013-03-07: 13:00:00 via INTRAVENOUS

## 2013-03-06 MED ORDER — CEFAZOLIN SODIUM-DEXTROSE 2-3 GM-% IV SOLR
2.0000 g | INTRAVENOUS | Status: AC
  Administered 2013-03-07: 2 g via INTRAVENOUS
  Filled 2013-03-06: qty 50

## 2013-03-06 NOTE — Progress Notes (Signed)
Patient notified to arrive at 1200 for surgery in am.

## 2013-03-07 ENCOUNTER — Inpatient Hospital Stay (HOSPITAL_COMMUNITY)
Admission: RE | Admit: 2013-03-07 | Discharge: 2013-03-09 | DRG: 470 | Disposition: A | Discharge status: Home Health | Payer: 59 | Source: Ambulatory Visit | Attending: Orthopedic Surgery | Admitting: Orthopedic Surgery

## 2013-03-07 ENCOUNTER — Encounter (HOSPITAL_COMMUNITY): Payer: Self-pay | Admitting: Certified Registered Nurse Anesthetist

## 2013-03-07 ENCOUNTER — Inpatient Hospital Stay (HOSPITAL_COMMUNITY): Payer: 59 | Admitting: Certified Registered Nurse Anesthetist

## 2013-03-07 ENCOUNTER — Inpatient Hospital Stay (HOSPITAL_COMMUNITY): Payer: 59

## 2013-03-07 ENCOUNTER — Encounter (HOSPITAL_COMMUNITY)
Admission: RE | Disposition: A | Discharge status: Home Health | Payer: Self-pay | Source: Ambulatory Visit | Attending: Orthopedic Surgery

## 2013-03-07 ENCOUNTER — Encounter (HOSPITAL_COMMUNITY): Payer: 59 | Admitting: Certified Registered Nurse Anesthetist

## 2013-03-07 DIAGNOSIS — M1710 Unilateral primary osteoarthritis, unspecified knee: Principal | ICD-10-CM | POA: Diagnosis present

## 2013-03-07 DIAGNOSIS — N390 Urinary tract infection, site not specified: Secondary | ICD-10-CM | POA: Diagnosis present

## 2013-03-07 DIAGNOSIS — R011 Cardiac murmur, unspecified: Secondary | ICD-10-CM | POA: Diagnosis present

## 2013-03-07 DIAGNOSIS — M179 Osteoarthritis of knee, unspecified: Secondary | ICD-10-CM | POA: Diagnosis present

## 2013-03-07 DIAGNOSIS — Z96659 Presence of unspecified artificial knee joint: Secondary | ICD-10-CM

## 2013-03-07 HISTORY — PX: TOTAL KNEE ARTHROPLASTY: SHX125

## 2013-03-07 SURGERY — ARTHROPLASTY, KNEE, TOTAL
Anesthesia: General | Site: Knee | Laterality: Left

## 2013-03-07 MED ORDER — LIDOCAINE HCL (CARDIAC) 20 MG/ML IV SOLN
INTRAVENOUS | Status: AC
  Filled 2013-03-07: qty 5

## 2013-03-07 MED ORDER — METOCLOPRAMIDE HCL 5 MG/ML IJ SOLN
5.0000 mg | Freq: Three times a day (TID) | INTRAMUSCULAR | Status: DC | PRN
Start: 2013-03-07 — End: 2013-03-09

## 2013-03-07 MED ORDER — FENTANYL CITRATE 0.05 MG/ML IJ SOLN
INTRAMUSCULAR | Status: AC
  Filled 2013-03-07: qty 5

## 2013-03-07 MED ORDER — 0.9 % SODIUM CHLORIDE (POUR BTL) OPTIME
TOPICAL | Status: DC | PRN
  Administered 2013-03-07: 1000 mL

## 2013-03-07 MED ORDER — LIDOCAINE HCL (CARDIAC) 20 MG/ML IV SOLN
INTRAVENOUS | Status: DC | PRN
  Administered 2013-03-07: 100 mg via INTRAVENOUS

## 2013-03-07 MED ORDER — METHOCARBAMOL 500 MG PO TABS: 500.0000 mg | ORAL_TABLET | Freq: Four times a day (QID) | ORAL | Status: DC

## 2013-03-07 MED ORDER — HYDROMORPHONE HCL PF 1 MG/ML IJ SOLN: 0.2500 mg | INTRAMUSCULAR | Status: DC | PRN

## 2013-03-07 MED ORDER — PROPOFOL 10 MG/ML IV BOLUS
INTRAVENOUS | Status: AC
  Filled 2013-03-07: qty 20

## 2013-03-07 MED ORDER — HYDROMORPHONE HCL PF 1 MG/ML IJ SOLN
INTRAMUSCULAR | Status: AC
  Administered 2013-03-07: 0.5 mg via INTRAVENOUS
  Filled 2013-03-07: qty 1

## 2013-03-07 MED ORDER — ACETAMINOPHEN 10 MG/ML IV SOLN
INTRAVENOUS | Status: AC
  Administered 2013-03-07: 1000 mg via INTRAVENOUS
  Filled 2013-03-07: qty 100

## 2013-03-07 MED ORDER — ACETAMINOPHEN 325 MG PO TABS: 650.0000 mg | ORAL_TABLET | Freq: Four times a day (QID) | ORAL | Status: DC | PRN

## 2013-03-07 MED ORDER — POTASSIUM CHLORIDE IN NACL 20-0.9 MEQ/L-% IV SOLN
INTRAVENOUS | Status: DC
  Administered 2013-03-07: 100 mL/h via INTRAVENOUS
  Filled 2013-03-07 (×2): qty 1000

## 2013-03-07 MED ORDER — OXYCODONE HCL 5 MG PO TABS
ORAL_TABLET | ORAL | Status: AC
  Filled 2013-03-07: qty 2

## 2013-03-07 MED ORDER — ACETAMINOPHEN 10 MG/ML IV SOLN
1000.0000 mg | Freq: Once | INTRAVENOUS | Status: AC
  Administered 2013-03-07: 1000 mg via INTRAVENOUS

## 2013-03-07 MED ORDER — MIDAZOLAM HCL 5 MG/5ML IJ SOLN
INTRAMUSCULAR | Status: DC | PRN
  Administered 2013-03-07: 2 mg via INTRAVENOUS

## 2013-03-07 MED ORDER — LACTATED RINGERS IV SOLN: INTRAVENOUS | Status: DC

## 2013-03-07 MED ORDER — DEXAMETHASONE 6 MG PO TABS
10.0000 mg | ORAL_TABLET | Freq: Three times a day (TID) | ORAL | Status: AC
  Administered 2013-03-08 (×2): 10 mg via ORAL
  Filled 2013-03-07 (×3): qty 1

## 2013-03-07 MED ORDER — DEXAMETHASONE SODIUM PHOSPHATE 10 MG/ML IJ SOLN
10.0000 mg | Freq: Three times a day (TID) | INTRAMUSCULAR | Status: AC
  Administered 2013-03-07: 10 mg via INTRAVENOUS
  Filled 2013-03-07 (×3): qty 1

## 2013-03-07 MED ORDER — BUPIVACAINE HCL (PF) 0.25 % IJ SOLN
INTRAMUSCULAR | Status: DC | PRN
  Administered 2013-03-07: 8 mL

## 2013-03-07 MED ORDER — OXYCODONE HCL 5 MG PO TABS
5.0000 mg | ORAL_TABLET | ORAL | Status: DC | PRN
  Administered 2013-03-07 (×3): 10 mg via ORAL
  Filled 2013-03-07: qty 2

## 2013-03-07 MED ORDER — SODIUM CHLORIDE 0.9 % IJ SOLN
INTRAMUSCULAR | Status: AC
  Filled 2013-03-07: qty 12

## 2013-03-07 MED ORDER — CEFAZOLIN SODIUM-DEXTROSE 2-3 GM-% IV SOLR
2.0000 g | Freq: Four times a day (QID) | INTRAVENOUS | Status: AC
  Administered 2013-03-07 – 2013-03-08 (×2): 2 g via INTRAVENOUS
  Filled 2013-03-07 (×2): qty 50

## 2013-03-07 MED ORDER — HYDROMORPHONE HCL PF 1 MG/ML IJ SOLN
0.5000 mg | INTRAMUSCULAR | Status: DC | PRN
  Administered 2013-03-07 (×2): 0.5 mg via INTRAVENOUS
  Filled 2013-03-07 (×2): qty 1

## 2013-03-07 MED ORDER — ASPIRIN EC 325 MG PO TBEC: 325.0000 mg | DELAYED_RELEASE_TABLET | Freq: Every day | ORAL | Status: DC

## 2013-03-07 MED ORDER — CIPROFLOXACIN HCL 250 MG PO TABS
250.0000 mg | ORAL_TABLET | Freq: Two times a day (BID) | ORAL | Status: DC
  Administered 2013-03-07 – 2013-03-08 (×3): 250 mg via ORAL
  Filled 2013-03-07 (×6): qty 1

## 2013-03-07 MED ORDER — CELECOXIB 200 MG PO CAPS
200.0000 mg | ORAL_CAPSULE | Freq: Two times a day (BID) | ORAL | Status: DC
  Administered 2013-03-07 – 2013-03-09 (×4): 200 mg via ORAL
  Filled 2013-03-07 (×5): qty 1

## 2013-03-07 MED ORDER — ASPIRIN EC 325 MG PO TBEC
325.0000 mg | DELAYED_RELEASE_TABLET | Freq: Every day | ORAL | Status: DC
  Administered 2013-03-08 – 2013-03-09 (×2): 325 mg via ORAL
  Filled 2013-03-07 (×3): qty 1

## 2013-03-07 MED ORDER — SODIUM CHLORIDE 0.9 % IJ SOLN
INTRAMUSCULAR | Status: DC | PRN
  Administered 2013-03-07: 40 mL

## 2013-03-07 MED ORDER — BISACODYL 5 MG PO TBEC: 5.0000 mg | DELAYED_RELEASE_TABLET | Freq: Every day | ORAL | Status: DC | PRN

## 2013-03-07 MED ORDER — SODIUM CHLORIDE 0.9 % IR SOLN
Status: DC | PRN
  Administered 2013-03-07: 3000 mL

## 2013-03-07 MED ORDER — OXYCODONE-ACETAMINOPHEN 5-325 MG PO TABS: 1.0000 | ORAL_TABLET | ORAL | Status: DC | PRN

## 2013-03-07 MED ORDER — BUPIVACAINE LIPOSOME 1.3 % IJ SUSP
INTRAMUSCULAR | Status: DC | PRN
  Administered 2013-03-07: 20 mL

## 2013-03-07 MED ORDER — DOCUSATE SODIUM 100 MG PO CAPS
100.0000 mg | ORAL_CAPSULE | Freq: Two times a day (BID) | ORAL | Status: DC
Start: 2013-03-07 — End: 2013-03-09
  Administered 2013-03-07 – 2013-03-09 (×4): 100 mg via ORAL
  Filled 2013-03-07 (×5): qty 1

## 2013-03-07 MED ORDER — PROPOFOL 10 MG/ML IV BOLUS
INTRAVENOUS | Status: DC | PRN
  Administered 2013-03-07: 100 mg via INTRAVENOUS

## 2013-03-07 MED ORDER — ONDANSETRON HCL 4 MG/2ML IJ SOLN
INTRAMUSCULAR | Status: AC
  Filled 2013-03-07: qty 2

## 2013-03-07 MED ORDER — PHENOL 1.4 % MT LIQD: 1.0000 | OROMUCOSAL | Status: DC | PRN

## 2013-03-07 MED ORDER — LACTATED RINGERS IV SOLN
INTRAVENOUS | Status: DC | PRN
  Administered 2013-03-07 (×2): via INTRAVENOUS

## 2013-03-07 MED ORDER — BUPIVACAINE HCL (PF) 0.25 % IJ SOLN
INTRAMUSCULAR | Status: AC
  Filled 2013-03-07: qty 30

## 2013-03-07 MED ORDER — ONDANSETRON HCL 4 MG/2ML IJ SOLN
4.0000 mg | Freq: Four times a day (QID) | INTRAMUSCULAR | Status: DC | PRN
  Administered 2013-03-08: 4 mg via INTRAVENOUS
  Filled 2013-03-07: qty 2

## 2013-03-07 MED ORDER — OXYCODONE HCL 5 MG PO TABS
ORAL_TABLET | ORAL | Status: AC
  Administered 2013-03-07: 10 mg via ORAL
  Filled 2013-03-07: qty 2

## 2013-03-07 MED ORDER — MIDAZOLAM HCL 2 MG/2ML IJ SOLN
INTRAMUSCULAR | Status: AC
  Filled 2013-03-07: qty 2

## 2013-03-07 MED ORDER — BUPIVACAINE LIPOSOME 1.3 % IJ SUSP
20.0000 mL | INTRAMUSCULAR | Status: DC
  Filled 2013-03-07: qty 20

## 2013-03-07 MED ORDER — HYDROMORPHONE HCL PF 1 MG/ML IJ SOLN
0.2500 mg | INTRAMUSCULAR | Status: DC | PRN
  Administered 2013-03-07 (×6): 0.5 mg via INTRAVENOUS

## 2013-03-07 MED ORDER — DIPHENHYDRAMINE HCL 12.5 MG/5ML PO ELIX
12.5000 mg | ORAL_SOLUTION | ORAL | Status: DC | PRN
  Administered 2013-03-08: 25 mg via ORAL
  Filled 2013-03-07 (×2): qty 10

## 2013-03-07 MED ORDER — HYDROCODONE-ACETAMINOPHEN 7.5-325 MG PO TABS
1.0000 | ORAL_TABLET | Freq: Four times a day (QID) | ORAL | Status: DC
  Administered 2013-03-07: 1 via ORAL
  Filled 2013-03-07: qty 1

## 2013-03-07 MED ORDER — MENTHOL 3 MG MT LOZG: 1.0000 | LOZENGE | OROMUCOSAL | Status: DC | PRN

## 2013-03-07 MED ORDER — METHOCARBAMOL 500 MG PO TABS
500.0000 mg | ORAL_TABLET | Freq: Four times a day (QID) | ORAL | Status: DC | PRN
  Administered 2013-03-07 – 2013-03-09 (×4): 500 mg via ORAL
  Filled 2013-03-07 (×5): qty 1

## 2013-03-07 MED ORDER — ONDANSETRON HCL 4 MG/2ML IJ SOLN
INTRAMUSCULAR | Status: DC | PRN
  Administered 2013-03-07: 4 mg via INTRAVENOUS

## 2013-03-07 MED ORDER — ONDANSETRON HCL 4 MG PO TABS: 4.0000 mg | ORAL_TABLET | Freq: Four times a day (QID) | ORAL | Status: DC | PRN

## 2013-03-07 MED ORDER — METHOCARBAMOL 100 MG/ML IJ SOLN
500.0000 mg | Freq: Four times a day (QID) | INTRAVENOUS | Status: DC | PRN
  Administered 2013-03-07: 500 mg via INTRAVENOUS
  Filled 2013-03-07 (×2): qty 5

## 2013-03-07 MED ORDER — DEXAMETHASONE SODIUM PHOSPHATE 4 MG/ML IJ SOLN
INTRAMUSCULAR | Status: DC | PRN
  Administered 2013-03-07: 4 mg via INTRAVENOUS

## 2013-03-07 MED ORDER — METOCLOPRAMIDE HCL 10 MG PO TABS: 5.0000 mg | ORAL_TABLET | Freq: Three times a day (TID) | ORAL | Status: DC | PRN

## 2013-03-07 MED ORDER — FENTANYL CITRATE 0.05 MG/ML IJ SOLN
INTRAMUSCULAR | Status: DC | PRN
  Administered 2013-03-07: 50 ug via INTRAVENOUS
  Administered 2013-03-07 (×2): 100 ug via INTRAVENOUS
  Administered 2013-03-07 (×3): 25 ug via INTRAVENOUS
  Administered 2013-03-07: 100 ug via INTRAVENOUS
  Administered 2013-03-07: 50 ug via INTRAVENOUS
  Administered 2013-03-07: 25 ug via INTRAVENOUS

## 2013-03-07 MED ORDER — ACETAMINOPHEN 650 MG RE SUPP: 650.0000 mg | Freq: Four times a day (QID) | RECTAL | Status: DC | PRN

## 2013-03-07 SURGICAL SUPPLY — 60 items
APL SKNCLS STERI-STRIP NONHPOA (GAUZE/BANDAGES/DRESSINGS) ×1
BANDAGE ESMARK 6X9 LF (GAUZE/BANDAGES/DRESSINGS) ×1 IMPLANT
BENZOIN TINCTURE PRP APPL 2/3 (GAUZE/BANDAGES/DRESSINGS) ×2 IMPLANT
BLADE SAG 18X100X1.27 (BLADE) ×4 IMPLANT
BNDG CMPR 9X6 STRL LF SNTH (GAUZE/BANDAGES/DRESSINGS) ×1
BNDG ESMARK 6X9 LF (GAUZE/BANDAGES/DRESSINGS) ×2
BOWL SMART MIX CTS (DISPOSABLE) ×2 IMPLANT
CEMENT BONE SIMPLEX SPEEDSET (Cement) ×3 IMPLANT
COVER SURGICAL LIGHT HANDLE (MISCELLANEOUS) ×2 IMPLANT
CUFF TOURNIQUET SINGLE 34IN LL (TOURNIQUET CUFF) ×2 IMPLANT
DRAPE EXTREMITY T 121X128X90 (DRAPE) ×2 IMPLANT
DRAPE PROXIMA HALF (DRAPES) ×2 IMPLANT
DRAPE U-SHAPE 47X51 STRL (DRAPES) ×2 IMPLANT
DRSG PAD ABDOMINAL 8X10 ST (GAUZE/BANDAGES/DRESSINGS) ×2 IMPLANT
DURAPREP 26ML APPLICATOR (WOUND CARE) ×2 IMPLANT
ELECT CAUTERY BLADE 6.4 (BLADE) ×2 IMPLANT
ELECT REM PT RETURN 9FT ADLT (ELECTROSURGICAL) ×2
ELECTRODE REM PT RTRN 9FT ADLT (ELECTROSURGICAL) ×1 IMPLANT
EVACUATOR 1/8 PVC DRAIN (DRAIN) ×2 IMPLANT
FACESHIELD LNG OPTICON STERILE (SAFETY) ×4 IMPLANT
GLOVE BIO SURGEON STRL SZ 6.5 (GLOVE) ×4 IMPLANT
GLOVE BIOGEL PI IND STRL 7.0 (GLOVE) ×2 IMPLANT
GLOVE BIOGEL PI INDICATOR 7.0 (GLOVE) ×2
GLOVE ORTHO TXT STRL SZ7.5 (GLOVE) ×2 IMPLANT
GOWN STRL REUS W/TWL 2XL LVL3 (GOWN DISPOSABLE) ×2 IMPLANT
HANDPIECE INTERPULSE COAX TIP (DISPOSABLE) ×2
IMMOBILIZER KNEE 22 UNIV (SOFTGOODS) ×2 IMPLANT
IMMOBILIZER KNEE 24 THIGH 36 (MISCELLANEOUS) IMPLANT
IMMOBILIZER KNEE 24 UNIV (MISCELLANEOUS)
KIT BASIN OR (CUSTOM PROCEDURE TRAY) ×2 IMPLANT
KIT ROOM TURNOVER OR (KITS) ×2 IMPLANT
KNEE/VIT E POLY LINER LEVEL 1B ×1 IMPLANT
MANIFOLD NEPTUNE II (INSTRUMENTS) ×2 IMPLANT
NDL HYPO 25GX1X1/2 BEV (NEEDLE) ×1 IMPLANT
NEEDLE HYPO 25GX1X1/2 BEV (NEEDLE) ×2 IMPLANT
NS IRRIG 1000ML POUR BTL (IV SOLUTION) ×2 IMPLANT
PACK TOTAL JOINT (CUSTOM PROCEDURE TRAY) ×2 IMPLANT
PAD ARMBOARD 7.5X6 YLW CONV (MISCELLANEOUS) ×4 IMPLANT
PAD CAST 4YDX4 CTTN HI CHSV (CAST SUPPLIES) ×1 IMPLANT
PADDING CAST ABS 6INX4YD NS (CAST SUPPLIES) ×1
PADDING CAST ABS COTTON 6X4 NS (CAST SUPPLIES) IMPLANT
PADDING CAST COTTON 4X4 STRL (CAST SUPPLIES) ×2
PADDING CAST COTTON 6X4 STRL (CAST SUPPLIES) ×2 IMPLANT
SET HNDPC FAN SPRY TIP SCT (DISPOSABLE) ×1 IMPLANT
SPONGE GAUZE 4X4 12PLY (GAUZE/BANDAGES/DRESSINGS) ×2 IMPLANT
SPONGE GAUZE 4X4 12PLY STER LF (GAUZE/BANDAGES/DRESSINGS) ×1 IMPLANT
STRIP CLOSURE SKIN 1/2X4 (GAUZE/BANDAGES/DRESSINGS) ×3 IMPLANT
SUCTION FRAZIER TIP 10 FR DISP (SUCTIONS) ×2 IMPLANT
SUT MNCRL AB 4-0 PS2 18 (SUTURE) ×2 IMPLANT
SUT MON AB 2-0 CT1 36 (SUTURE) ×2 IMPLANT
SUT VIC AB 0 CT1 27 (SUTURE) ×2
SUT VIC AB 0 CT1 27XBRD ANBCTR (SUTURE) ×1 IMPLANT
SUT VIC AB 1 CTX 27 (SUTURE) ×1 IMPLANT
SUT VIC AB 2-0 CT1 27 (SUTURE) ×2
SUT VIC AB 2-0 CT1 TAPERPNT 27 (SUTURE) ×1 IMPLANT
SYR 50ML LL SCALE MARK (SYRINGE) ×2 IMPLANT
SYR CONTROL 10ML LL (SYRINGE) ×2 IMPLANT
TOWEL OR 17X24 6PK STRL BLUE (TOWEL DISPOSABLE) ×2 IMPLANT
TOWEL OR 17X26 10 PK STRL BLUE (TOWEL DISPOSABLE) ×2 IMPLANT
WATER STERILE IRR 1000ML POUR (IV SOLUTION) ×4 IMPLANT

## 2013-03-07 NOTE — Anesthesia Postprocedure Evaluation (Signed)
Anesthesia Post Note  Patient: Veronica Morton  Procedure(s) Performed: Procedure(s) (LRB): LEFT TOTAL KNEE ARTHROPLASTY (Left)  Anesthesia type: general  Patient location: PACU  Post pain: Pain level controlled  Post assessment: Patient's Cardiovascular Status Stable  Last Vitals:  Filed Vitals:   03/07/13 1700  BP: 173/89  Pulse: 86  Temp:   Resp: 14    Post vital signs: Reviewed and stable  Level of consciousness: sedated  Complications: No apparent anesthesia complications

## 2013-03-07 NOTE — Discharge Summary (Addendum)
Patient ID: Veronica Morton MRN: [id number] DOB/AGE: [birth year] 53 y.o.  Admit date: 03/07/2013 Discharge date: 03/09/2013  Admission Diagnoses:  Active Problems:   DJD (degenerative joint disease) of knee   Discharge Diagnoses:  Same  Past Medical History  Diagnosis Date  . Murmur     Surgeries: Procedure(s): LEFT TOTAL KNEE ARTHROPLASTY on 03/07/2013   Consultants:    Discharged Condition: Improved  Hospital Course: FATUMA CANIZALES is an 53 y.o. female who was admitted 03/07/2013 for operative treatment of<principal problem not specified>. Patient has severe unremitting pain that affects sleep, daily activities, and work/hobbies. After pre-op clearance the patient was taken to the operating room on 03/07/2013 and underwent  Procedure(s): LEFT TOTAL KNEE ARTHROPLASTY.    Patient was given perioperative antibiotics:     Anti-infectives   Start     Dose/Rate Route Frequency Ordered Stop   03/07/13 2000  ciprofloxacin (CIPRO) tablet 250 mg  Status:  Discontinued     250 mg Oral 2 times daily 03/07/13 1952 03/09/13 0752   03/07/13 2000  ceFAZolin (ANCEF) IVPB 2 g/50 mL premix     2 g 100 mL/hr over 30 Minutes Intravenous Every 6 hours 03/07/13 1952 03/08/13 0233   03/07/13 0600  ceFAZolin (ANCEF) IVPB 2 g/50 mL premix     2 g 100 mL/hr over 30 Minutes Intravenous On call to O.R. 03/06/13 1344 03/07/13 1336       Patient was given sequential compression devices, early ambulation, and chemoprophylaxis to prevent DVT.  Patient benefited maximally from hospital stay and there were no complications.    Recent vital signs:  Patient Vitals for the past 24 hrs:  BP Temp Temp src Pulse Resp SpO2  03/09/13 0557 135/64 mmHg 98.5 F (36.9 C) - 95 18 99 %  03/09/13 0400 - - - - 18 99 %  03/09/13 0000 - - - - 18 98 %  03/08/13 2245 - 98.2 F (36.8 C) - - - -  03/08/13 2123 134/67 mmHg 95.5 F (35.3 C) - 93 18 99 %  03/08/13 2000 - - - - 18 -  03/08/13 1600 - - - - 18 98 %   03/08/13 1340 122/64 mmHg 97.4 F (36.3 C) Oral 85 20 97 %  03/08/13 1200 - - - - 18 -  03/08/13 1003 127/72 mmHg 98 F (36.7 C) Oral 90 18 100 %  03/08/13 0938 - - - - 18 100 %  03/08/13 0800 - - - - 18 -     Recent laboratory studies:   Recent Labs  03/08/13 0430 03/09/13 0445  WBC 10.5 14.0*  HGB 10.0* 8.9*  HCT 30.5* 27.3*  PLT 201 205  NA 138 142  K 4.1 4.3  CL 102 106  CO2 24 25  BUN 8 9  CREATININE 0.61 0.58  GLUCOSE 172* 135*  CALCIUM 8.8 8.9     Discharge Medications:     Medication List    STOP taking these medications       ALEVE PO     ciprofloxacin 250 MG tablet  Commonly known as:  CIPRO      TAKE these medications       aspirin EC 325 MG tablet  Take 1 tablet (325 mg total) by mouth daily.     bisacodyl 5 MG EC tablet  Commonly known as:  DULCOLAX  Take 1 tablet (5 mg total) by mouth daily as needed for moderate constipation.     DOXYLAMINE SUCCINATE (SLEEP)  PO  Take 1 tablet by mouth at bedtime.     methocarbamol 500 MG tablet  Commonly known as:  ROBAXIN  Take 1 tablet (500 mg total) by mouth 4 (four) times daily.     oxyCODONE-acetaminophen 5-325 MG per tablet  Commonly known as:  ROXICET  Take 1-2 tablets by mouth every 4 (four) hours as needed for severe pain.        Diagnostic Studies: Dg Chest 2 View  03/01/2013   CLINICAL DATA:  53 year old female with end-stage degenerative joint disease. Initial encounter.  EXAM: CHEST  2 VIEW  COMPARISON:  07/29/2011.  FINDINGS: Lung volumes are stable and within normal limits. Normal cardiac size and mediastinal contours. Visualized tracheal air column is within normal limits. Stable mild increased interstitial markings. No pneumothorax, pulmonary edema, pleural effusion or confluent pulmonary opacity. No acute osseous abnormality identified.  IMPRESSION: No acute cardiopulmonary abnormality.   Electronically Signed   By: Lars Pinks M.D.   On: 03/01/2013 14:27    Disposition: 01-Home or  Self Care  Discharge Orders   Future Orders Complete By Expires   Call MD / Call 911  As directed    Comments:     If you experience chest pain or shortness of breath, CALL 911 and be transported to the hospital emergency room.  If you develope a fever above 101 F, pus (white drainage) or increased drainage or redness at the wound, or calf pain, call your surgeon's office.   Change dressing  As directed    Comments:     Change dressing on Saturday, then change the dressing daily with sterile 4 x 4 inch gauze dressing and apply TED hose.  You may clean the incision with alcohol prior to redressing.   Constipation Prevention  As directed    Comments:     Drink plenty of fluids.  Prune juice may be helpful.  You may use a stool softener, such as Colace (over the counter) 100 mg twice a day.  Use MiraLax (over the counter) for constipation as needed.   CPM  As directed    Comments:     Continuous passive motion machine (CPM):      Use the CPM from 0- to 60 for 6 hours per day.      You may increase by 10 per day.  You may break it up into 2 or 3 sessions per day.      Use CPM for 3-4 weeks or until you are told to stop.   Diet - low sodium heart healthy  As directed    Discharge instructions  As directed    Comments:     Weight bearing as tolerated.  May change dressing on Saturday.  May shower on Monday, but do not soak incision.  May use ice for up to 20 minutes at a time for pain and swelling.  Take Aspirin 1 tab a day for the next 30 days to prevent blood clots.  Follow up appointment in our office in one week.   Do not put a pillow under the knee. Place it under the heel.  As directed    Comments:     Place gray foam under operative heel when in bed or in a chair to work on extension   Increase activity slowly as tolerated  As directed    TED hose  As directed    Comments:     Use stockings (TED hose) for 2 weeks on both leg(s).  You may remove them at night for sleeping.       Follow-up Information   Follow up with [facility], MD. Schedule an appointment as soon as possible for a visit in 2 weeks.   Specialty:  Orthopedic Surgery   Contact information:   [place] [address] [phone]        Signed: Larae Grooms 03/09/2013, 7:54 AM

## 2013-03-07 NOTE — Progress Notes (Signed)
Orthopedic Tech Progress Note Patient Details:  Veronica Morton November 06, 1960 [id number] Footsie roll CPM Left Knee CPM Left Knee: On Left Knee Flexion (Degrees): 60 Left Knee Extension (Degrees): 0 Additional Comments: put ohf on bed   Braulio Bosch 03/07/2013, 7:34 PM

## 2013-03-07 NOTE — Interval H&P Note (Signed)
History and Physical Interval Note:  03/07/2013 8:01 AM  Veronica Morton  has presented today for surgery, with the diagnosis of DEGENERATIVE JOINT DISEASE LEFT KNEE  The various methods of treatment have been discussed with the patient and family. After consideration of risks, benefits and other options for treatment, the patient has consented to  Procedure(s): LEFT TOTAL KNEE ARTHROPLASTY (Left) as a surgical intervention .  The patient's history has been reviewed, patient examined, no change in status, stable for surgery.  I have reviewed the patient's chart and labs.  Questions were answered to the patient's satisfaction.     Jasmon Graffam F

## 2013-03-07 NOTE — Anesthesia Preprocedure Evaluation (Addendum)
Anesthesia Evaluation  Patient identified by MRN, date of birth, ID band Patient awake    Reviewed: Allergy & Precautions, H&P , NPO status , Patient's Chart, lab work & pertinent test results  History of Anesthesia Complications Negative for: history of anesthetic complications  Airway Mallampati: I TM Distance: >3 FB Neck ROM: Full    Dental  (+) Teeth Intact, Dental Advisory Given   Pulmonary neg pulmonary ROS,  breath sounds clear to auscultation        Cardiovascular Exercise Tolerance: Good negative cardio ROS  Valvular problems/murmurs: heart murmur. Rhythm:Regular     Neuro/Psych negative neurological ROS  negative psych ROS   GI/Hepatic negative GI ROS, Neg liver ROS,   Endo/Other  negative endocrine ROS  Renal/GU negative Renal ROS     Musculoskeletal negative musculoskeletal ROS (+)   Abdominal   Peds  Hematology negative hematology ROS (+)   Anesthesia Other Findings   Reproductive/Obstetrics negative OB ROS                        Anesthesia Physical Anesthesia Plan  ASA: I  Anesthesia Plan: General   Post-op Pain Management:    Induction: Intravenous  Airway Management Planned: LMA  Additional Equipment:   Intra-op Plan:   Post-operative Plan: Extubation in OR  Informed Consent: I have reviewed the patients History and Physical, chart, labs and discussed the procedure including the risks, benefits and alternatives for the proposed anesthesia with the patient or authorized representative who has indicated his/her understanding and acceptance.   Dental advisory given  Plan Discussed with: CRNA, Anesthesiologist and Surgeon  Anesthesia Plan Comments:       Anesthesia Quick Evaluation

## 2013-03-07 NOTE — Transfer of Care (Signed)
Immediate Anesthesia Transfer of Care Note  Patient: Veronica Morton  Procedure(s) Performed: Procedure(s): LEFT TOTAL KNEE ARTHROPLASTY (Left)  Patient Location: PACU  Anesthesia Type:General  Level of Consciousness: awake, alert  and oriented  Airway & Oxygen Therapy: Patient Spontanous Breathing and Patient connected to nasal cannula oxygen  Post-op Assessment: Report given to PACU RN, Post -op Vital signs reviewed and stable and Patient moving all extremities  Post vital signs: Reviewed and stable  Complications: No apparent anesthesia complications

## 2013-03-07 NOTE — Preoperative (Signed)
Beta Blockers   Reason not to administer Beta Blockers:Not Applicable 

## 2013-03-07 NOTE — H&P (View-Only) (Signed)
TOTAL KNEE ADMISSION H&P  Patient is being admitted for left total knee arthroplasty.  Subjective:  Chief Complaint:left knee pain.  HPI: Veronica Morton, 53 y.o. female, has a history of pain and functional disability in the left knee due to arthritis and has failed non-surgical conservative treatments for greater than 12 weeks to includeNSAID's and/or analgesics, viscosupplementation injections and activity modification.  Onset of symptoms was gradual, starting 3 years ago with gradually worsening course since that time. The patient noted no past surgery on the left knee(s).  Patient currently rates pain in the left knee(s) at 6 out of 10 with activity. Patient has night pain, worsening of pain with activity and weight bearing, pain that interferes with activities of daily living, pain with passive range of motion, crepitus and joint swelling.  Patient has evidence of periarticular osteophytes and joint space narrowing by imaging studies. There is no active infection.  There are no active problems to display for this patient.  Past Medical History  Diagnosis Date  . Murmur     Past Surgical History  Procedure Laterality Date  . Tubal ligation    . Appendectomy    . Total knee arthroplasty  08/04/2011    Procedure: TOTAL KNEE ARTHROPLASTY;  Surgeon: Ninetta Lights, MD;  Location: [facility];  Service: Orthopedics;  Laterality: Right;     (Not in a hospital admission) No Known Allergies  History  Substance Use Topics  . Smoking status: Never Smoker   . Smokeless tobacco: Never Used  . Alcohol Use: Yes     Comment: occ    Family History  Problem Relation Age of Onset  . Rectal cancer Father   . Colon cancer Paternal Grandmother      Review of Systems  Constitutional: Negative.   HENT: Negative.   Eyes: Negative.   Respiratory: Negative.   Cardiovascular: Negative.   Gastrointestinal: Negative.   Genitourinary: Negative.   Musculoskeletal: Positive for joint pain.  Skin:  Negative.   Neurological: Negative.   Endo/Heme/Allergies: Negative.   Psychiatric/Behavioral: Negative.     Objective:  Physical Exam  Constitutional: She is oriented to person, place, and time. She appears well-developed and well-nourished.  HENT:  Head: Normocephalic and atraumatic.  Eyes: EOM are normal. Pupils are equal, round, and reactive to light.  Neck: Normal range of motion. Neck supple.  Cardiovascular: Normal rate and regular rhythm.   Murmur heard. Respiratory: Effort normal and breath sounds normal. No respiratory distress. She has no wheezes. She has no rales.  GI: Soft. Bowel sounds are normal. She exhibits no distension. There is no tenderness.  Musculoskeletal:  ROM from 5-120 degrees.  Positive patellofemoral crepitus.  Ligaments stable.  Negative straight leg raise, negative log roll.  Some varus alignment.  Neurovascularly intact distally.  Neurological: She is alert and oriented to person, place, and time.  Skin: Skin is warm and dry.  Psychiatric: She has a normal mood and affect. Her behavior is normal. Judgment and thought content normal.    Vital signs in last 24 hours: @VSRANGES @  Labs:   Estimated body mass index is 37.85 kg/(m^2) as calculated from the following:   Height as of 01/25/13: 5\' 2"  (1.575 m).   Weight as of 01/25/13: 93.895 kg (207 lb).   Imaging Review Plain radiographs demonstrate severe degenerative joint disease of the left knee(s). The overall alignment ismild varus. The bone quality appears to be fair for age and reported activity level.  Assessment/Plan:  End stage arthritis, left  knee   The patient history, physical examination, clinical judgment of the provider and imaging studies are consistent with end stage degenerative joint disease of the left knee(s) and total knee arthroplasty is deemed medically necessary. The treatment options including medical management, injection therapy arthroscopy and arthroplasty were discussed  at length. The risks and benefits of total knee arthroplasty were presented and reviewed. The risks due to aseptic loosening, infection, stiffness, patella tracking problems, thromboembolic complications and other imponderables were discussed. The patient acknowledged the explanation, agreed to proceed with the plan and consent was signed. Patient is being admitted for inpatient treatment for surgery, pain control, PT, OT, prophylactic antibiotics, VTE prophylaxis, progressive ambulation and ADL's and discharge planning. The patient is planning to be discharged home with home health services   

## 2013-03-08 ENCOUNTER — Encounter (HOSPITAL_COMMUNITY): Payer: Self-pay | Admitting: *Deleted

## 2013-03-08 LAB — CBC
HEMATOCRIT: 30.5 % — AB (ref 36.0–46.0)
Hemoglobin: 10 g/dL — ABNORMAL LOW (ref 12.0–15.0)
MCH: 25.1 pg — ABNORMAL LOW (ref 26.0–34.0)
MCHC: 32.8 g/dL (ref 30.0–36.0)
MCV: 76.4 fL — ABNORMAL LOW (ref 78.0–100.0)
Platelets: 201 10*3/uL (ref 150–400)
RBC: 3.99 MIL/uL (ref 3.87–5.11)
RDW: 14 % (ref 11.5–15.5)
WBC: 10.5 10*3/uL (ref 4.0–10.5)

## 2013-03-08 LAB — BASIC METABOLIC PANEL
BUN: 8 mg/dL (ref 6–23)
CALCIUM: 8.8 mg/dL (ref 8.4–10.5)
CO2: 24 mEq/L (ref 19–32)
CREATININE: 0.61 mg/dL (ref 0.50–1.10)
Chloride: 102 mEq/L (ref 96–112)
GFR calc Af Amer: >90 mL/min (ref >90)
GFR calc non Af Amer: >90 mL/min (ref >90)
Glucose, Bld: 172 mg/dL — ABNORMAL HIGH (ref 70–99)
Potassium: 4.1 mEq/L (ref 3.7–5.3)
Sodium: 138 mEq/L (ref 137–147)

## 2013-03-08 MED ORDER — DIPHENHYDRAMINE HCL 50 MG/ML IJ SOLN: 12.5000 mg | Freq: Four times a day (QID) | INTRAMUSCULAR | Status: DC | PRN

## 2013-03-08 MED ORDER — DIPHENHYDRAMINE HCL 12.5 MG/5ML PO ELIX: 12.5000 mg | ORAL_SOLUTION | Freq: Four times a day (QID) | ORAL | Status: DC | PRN

## 2013-03-08 MED ORDER — NALOXONE HCL 0.4 MG/ML IJ SOLN
0.4000 mg | INTRAMUSCULAR | Status: DC | PRN
Start: 2013-03-08 — End: 2013-03-09

## 2013-03-08 MED ORDER — HYDROMORPHONE 0.3 MG/ML IV SOLN
INTRAVENOUS | Status: DC
  Administered 2013-03-08: 01:00:00 via INTRAVENOUS
  Administered 2013-03-08: 1.8 mg via INTRAVENOUS
  Administered 2013-03-08: 3.9 mg via INTRAVENOUS
  Administered 2013-03-08: 1.5 mg via INTRAVENOUS
  Administered 2013-03-08: 10:00:00 via INTRAVENOUS
  Administered 2013-03-08: 1.8 mg via INTRAVENOUS
  Administered 2013-03-09: 1.2 mg via INTRAVENOUS
  Filled 2013-03-08 (×2): qty 25

## 2013-03-08 MED ORDER — SODIUM CHLORIDE 0.9 % IJ SOLN: 9.0000 mL | INTRAMUSCULAR | Status: DC | PRN

## 2013-03-08 MED ORDER — ONDANSETRON HCL 4 MG/2ML IJ SOLN: 4.0000 mg | Freq: Four times a day (QID) | INTRAMUSCULAR | Status: DC | PRN

## 2013-03-08 NOTE — Evaluation (Signed)
Physical Therapy Evaluation Patient Details Name: TUWANDA VOKES MRN: [id number] DOB: 1960/07/09 Today's Date: 03/08/2013 Time: 1000-1028 PT Time Calculation (min): 28 min  PT Assessment / Plan / Recommendation History of Present Illness  s/p L TKA  Clinical Impression  This patient underwent a left TKA and presents to PT with anticipated post-op decrease in strength and ROM, decreased functional mobility and gait.  Pt. Will benefit from acute PT to address these and below issues.  Pt. Was limited by nausea and mild dizziness during session.  Will advance as she tolerates and work toward DC home tomorrow after therapy sessions.       PT Assessment  Patient needs continued PT services    Follow Up Recommendations  Home health PT;Supervision/Assistance - 24 hour    Does the patient have the potential to tolerate intense rehabilitation      Barriers to Discharge        Equipment Recommendations  None recommended by PT    Recommendations for Other Services     Frequency 7X/week    Precautions / Restrictions Precautions Precautions: Knee Precaution Booklet Issued: Yes (comment) Precaution Comments: provided pt. with exercise and precaution handout and reviewed Required Braces or Orthoses: Knee Immobilizer - Left Knee Immobilizer - Left: Other (comment) (until DC'd ) Restrictions Weight Bearing Restrictions: Yes LLE Weight Bearing: Weight bearing as tolerated   Pertinent Vitals/Pain See vitals tab Pain did not appear to be a significant limiting factor this session      Mobility  Bed Mobility Overal bed mobility: Needs Assistance Bed Mobility: Supine to Sit Supine to sit: Min assist General bed mobility comments: Assist to support LLE. Transfers Overall transfer level: Needs assistance Equipment used: Rolling walker (2 wheeled) Transfers: Sit to/from Stand Sit to Stand: +2 safety/equipment;Min assist General transfer comment: cues for sequencing and  technique Ambulation/Gait Ambulation/Gait assistance: Min guard;+2 safety/equipment Ambulation Distance (Feet): 10 Feet Assistive device: Rolling walker (2 wheeled) Gait Pattern/deviations: Step-to pattern Gait velocity: decreased General Gait Details: limited distance due to pt. beginning to feel nauseated and dizzy; min guard for safety and second person for equipment    Exercises Total Joint Exercises Ankle Circles/Pumps: AROM;Both;10 reps Quad Sets: AROM;Both;10 reps Long Arc Quad: AROM;Left;10 reps;Seated Knee Flexion: AROM;Left;5 reps;Seated Goniometric ROM:  ~0 to 80   PT Diagnosis: Difficulty walking;Acute pain  PT Problem List: Decreased strength;Decreased range of motion;Decreased activity tolerance;Decreased balance;Decreased mobility;Decreased knowledge of use of DME;Decreased knowledge of precautions;Pain PT Treatment Interventions: DME instruction;Gait training;Functional mobility training;Therapeutic activities;Therapeutic exercise;Balance training;Patient/family education     PT Goals(Current goals can be found in the care plan section) Acute Rehab PT Goals Patient Stated Goal: to return home PT Goal Formulation: With patient Time For Goal Achievement: 03/15/13 Potential to Achieve Goals: Good  Visit Information  Last PT Received On: 03/08/13 Assistance Needed: +2 Reason for Co-Treatment: For patient/therapist safety (addressed mobility) OT goals addressed during session: ADL's and self-care History of Present Illness: s/p L TKA       [place] expects to be discharged to:: Private residence Living Arrangements: Spouse/significant other;Children Available Help at Discharge: Family;Available 24 hours/day Type of Home: House Home Access: Ramped entrance Home Layout: One level Home Equipment: Walker - 2 wheels;Cane - single point;Shower seat;Bedside commode;Hand held shower head;Grab bars - toilet Prior Function Level of  Independence: Independent Communication Communication: No difficulties    Cognition  Cognition Arousal/Alertness: Awake/alert Behavior During Therapy: WFL for tasks assessed/performed Overall Cognitive Status: Within Functional Limits for  tasks assessed    Extremity/Trunk Assessment Upper Extremity Assessment Upper Extremity Assessment: Overall WFL for tasks assessed Lower Extremity Assessment Lower Extremity Assessment: LLE deficits/detail LLE Deficits / Details: good ankle pump, good quad set, able to long arc quad with no lag Cervical / Trunk Assessment Cervical / Trunk Assessment: Normal   Balance Balance Overall balance assessment: Needs assistance Standing balance support: Bilateral upper extremity supported;During functional activity Standing balance comment: min guard for safety with RW support  End of Session PT - End of Session Equipment Utilized During Treatment: Gait belt;Left knee immobilizer Activity Tolerance: Treatment limited secondary to medical complications (Comment);Other (comment) (limited by nausea/dizziness) Patient left: in chair;with call bell/phone within reach Nurse Communication: Mobility status;Other (comment) (pt's nausea and dizziness)  GP     Ladona Ridgel 03/08/2013, 11:59 AM Gerlean Ren PT Acute Rehab Services [phone] [place] [phone]

## 2013-03-08 NOTE — Progress Notes (Signed)
Orthopedic Tech Progress Note Patient Details:  Veronica Morton [birth year] [id number]  Patient ID: [name], female   DOB: [birth year], 53 y.o.   MRN: [id number] Placed pt's lle in cpm @ 0-70 degrees @2000   Hildred Priest 03/08/2013, 8:01 PM

## 2013-03-08 NOTE — Care Management Note (Signed)
CARE MANAGEMENT NOTE 03/08/2013  Patient:  Veronica Morton, Veronica Morton   Account Number:  [id number]  Date Initiated:  03/08/2013  Documentation initiated by:  Ricki Miller  Subjective/Objective Assessment:   53 yr old female s/p left total knee arthroplasty.     Action/Plan:   Case manager spoke with patient concerning home health and DME needs at discharge. Preoperatively setup with [facility], no changes.Rollling walker, 3in1. and CPM have been delivered.Patient has support at discharge.   Anticipated DC Date:  03/09/2013   Anticipated DC Plan:  [place]  CM consult      [facility] Choice  HOME HEALTH  DURABLE MEDICAL EQUIPMENT   Choice offered to / List presented to:  C-1 Patient   DME arranged  CPM      DME agency  [employer]     [place] arranged  HH-2 PT      [place].   Status of service:  Completed, signed off Medicare Important Message given?   (If response is "NO", the following Medicare IM given date fields will be blank) Date Medicare IM given:   Date Additional Medicare IM given:    Discharge Disposition:  [place]  Per UR Regulation:

## 2013-03-08 NOTE — Op Note (Signed)
NAMEALDINE, CHAKRABORTY               ACCOUNT NO.:  [id number]  MEDICAL RECORD NO.:  [id number]  LOCATION:  5N17C                        FACILITY:  [facility]  PHYSICIAN:  Ninetta Lights, M.D. DATE OF BIRTH:  01-16-1961  DATE OF PROCEDURE:  03/07/2013 DATE OF DISCHARGE:                              OPERATIVE REPORT   PREOPERATIVE DIAGNOSIS:  Left knee end-stage degenerative arthritis, varus alignment, mild flexion contracture.  POSTOPERATIVE DIAGNOSIS:  Left knee end-stage degenerative arthritis, varus alignment, mild flexion contracture.  PROCEDURE:  Left knee modified minimally invasive total knee replacement with Stryker triathlon prosthesis.  Soft tissue balancing.  Cemented pegged posterior stabilized #4 femoral component.  Cemented #4 tibial component, 9 mm polyethylene insert.  Cemented resurfacing 35-mm patellar component.  SURGEON:  Ninetta Lights, M.D.  ASSISTANT:  Eula Listen, PA; present throughout the entire case, necessary for timely completion of procedure.  ANESTHESIA:  General.  BLOOD LOSS:  Minimal.  SPECIMENS:  None.  CULTURES:  None.  COMPLICATIONS:  None.  DRESSINGS:  Soft compressive knee immobilizer.  TOURNIQUET TIME:  1 hour.  PROCEDURE:  The patient was brought to the operating room and placed on the operating table in supine position.  After adequate anesthesia had been obtained, tourniquet applied, prepped and draped in usual sterile fashion.  Exsanguinated with elevation of Esmarch.  Tourniquet inflated to 350 mmHg.  Straight incision above the patella down the tibial tubercle.  Skin and subcutaneous tissue divided.  Medial arthrotomy, vastus splitting.  Moderate effusion.  Medial capsule release.  Grade 4 changes most marked medially.  Remnants of menisci, cruciate ligaments, spurs removed.  Distal femur exposed.  Flexible intramedullary guide.  8 mm resection 5 degrees of valgus.  Using epicondylar axis, the femur was sized, cut, and  fitted for posterior stabilized pegged #4 component. Proximal tibial resection extramedullary guide 3-degree posterior slope cut.  Taken below the defect medially.  Size #4 component.  Debris cleared throughout the knee.  Patella exposed.  Posterior 10 mm removed. Drilled, sized, and fitted for 35 mm component.  Trials put in place. #4 above and below 9 mm insert, 35 patella.  With this construct, I was very pleased by mechanical axis, balancing function and extension, good motion, good stability, good patellofemoral tracking.  Tibia was marked for rotation and hand reamed.  All trials removed.  Copious irrigation with pulse irrigating device.  Cement prepared, placed on all components, firmly seated.  Polyethylene attached to tibia, knee reduced.  Patella held with clamp.  Once cement hardened, the knee was irrigated again.  Soft tissues injected with Exparel.  Hemovac was placed but during placement of the dressing, this was partially pulled out and I elected to completely remove that rather than leave it half in and half out.  The arthrotomy closed with #1 Vicryl.  Skin and subcutaneous tissue with subcutaneous subcuticular closure.  Margins were injected with Marcaine.  Sterile compressive dressing applied. Tourniquet deflated and removed.  Knee immobilizer applied.  Anesthesia reversed.  Brought to the recovery room.  Tolerated the surgery well. No complications.     Ninetta Lights, M.D.     DFM/MEDQ  D:  03/07/2013  T:  03/08/2013  Job:  [id number]

## 2013-03-08 NOTE — Evaluation (Signed)
Occupational Therapy Evaluation Patient Details Name: Veronica Morton MRN: [id number] DOB: 1960-02-22 Today's Date: 03/08/2013 Time: 1000-1020 OT Time Calculation (min): 20 min  OT Assessment / Plan / Recommendation History of present illness s/p L TKA   Clinical Impression   Pt admitted with above. Will continue to follow acutely in order to address below problem list. Recommending 24/7 assist at home.    OT Assessment  Patient needs continued OT Services    Follow Up Recommendations  No OT follow up;Supervision/Assistance - 24 hour    Barriers to Discharge      Equipment Recommendations  None recommended by OT    Recommendations for Other Services    Frequency  Min 2X/week    Precautions / Restrictions Precautions Precautions: Knee Restrictions Weight Bearing Restrictions: Yes LLE Weight Bearing: Weight bearing as tolerated   Pertinent Vitals/Pain See vitals    ADL  Eating/Feeding: Independent;Performed Where Assessed - Eating/Feeding: Edge of bed Grooming: Performed;Wash/dry face;Set up Where Assessed - Grooming: Unsupported sitting Upper Body Dressing: Performed;Set up Where Assessed - Upper Body Dressing: Unsupported sitting Toilet Transfer: Simulated;+2 Total assistance;Minimal assistance Toilet Transfer Method: Sit to stand Toilet Transfer Equipment:  (bed>chair) Equipment Used: Gait belt;Knee Immobilizer;Rolling walker Transfers/Ambulation Related to ADLs: Min guard for ambulation with +2 to assist with equipment/line management and chair follow (pt feeling light headed and nauseous). ADL Comments: Pt interested in learning to use AE for LB ADLs but to lightheaded/nauseous following ambulation to focus on AE education.    OT Diagnosis: Generalized weakness;Acute pain  OT Problem List: Decreased strength;Decreased activity tolerance;Decreased knowledge of use of DME or AE;Pain OT Treatment Interventions: Self-care/ADL training;DME and/or AE  instruction;Therapeutic activities;Patient/family education   OT Goals(Current goals can be found in the care plan section) Acute Rehab OT Goals Patient Stated Goal: to return home OT Goal Formulation: With patient Time For Goal Achievement: 03/15/13 Potential to Achieve Goals: Good  Visit Information  Last OT Received On: 03/08/13 Assistance Needed: +2 PT/OT/SLP Co-Evaluation/Treatment: Yes Reason for Co-Treatment: For patient/therapist safety OT goals addressed during session: ADL's and self-care History of Present Illness: s/p L TKA       Prior Functioning     Home Living Family/patient expects to be discharged to:: Private residence Living Arrangements: Spouse/significant other;Children Available Help at Discharge: Family;Available 24 hours/day Type of Home: House Home Access: Ramped entrance Home Layout: One level Home Equipment: Walker - 2 wheels;Cane - single point;Shower seat;Bedside commode;Hand held shower head;Grab bars - toilet Prior Function Level of Independence: Independent Communication Communication: No difficulties         Vision/Perception     Cognition  Cognition Arousal/Alertness: Awake/alert Behavior During Therapy: WFL for tasks assessed/performed Overall Cognitive Status: Within Functional Limits for tasks assessed    Extremity/Trunk Assessment Upper Extremity Assessment Upper Extremity Assessment: Overall WFL for tasks assessed     Mobility Bed Mobility Overal bed mobility: Needs Assistance Bed Mobility: Supine to Sit Supine to sit: Min assist General bed mobility comments: Assist to support LLE. Transfers Overall transfer level: Needs assistance Equipment used: Rolling walker (2 wheeled) Transfers: Sit to/from Stand Sit to Stand: +2 safety/equipment;Min assist General transfer comment: VCs for sequencing and technique.       Exercise     Balance     End of Session OT - End of Session Equipment Utilized During Treatment:  Gait belt;Rolling walker Activity Tolerance: Other (comment) (limited by nausea/light headedness) Patient left: in chair;with call bell/phone within reach (with PT) Nurse Communication: Mobility status  GO    03/08/2013 Darrol Jump OTR/L Pager [phone] Office [phone]  Darrol Jump 03/08/2013, 10:48 AM

## 2013-03-08 NOTE — Progress Notes (Signed)
Utilization review completed.  

## 2013-03-08 NOTE — Progress Notes (Signed)
Orthopedic Tech Progress Note Patient Details:  Veronica Morton 07/31/1960 [id number] Spoke with patient in room. Patient was seated in chair and decided to stay in chair for a while longer. Patient informed that Ortho would be back around this evening for CPM application Patient ID: Veronica Morton, female   DOB: 1960/04/28, 53 y.o.   MRN: [id number]   Fenton Foy 03/08/2013, 2:57 PM

## 2013-03-08 NOTE — Progress Notes (Signed)
Subjective: 1 Day Post-Op Procedure(s) (LRB): LEFT TOTAL KNEE ARTHROPLASTY (Left) Patient reports pain as 2 on 0-10 scale.  Patient experienced a great deal of pain last night after getting up to use the restroom.  Was given pain meds and pain has since resolved.  Doing much better this morning.  No nausea/vomiting, lightheadedness/dizziness.  No flatus and no bm as of yet.  Tolerating diet.  Objective: Vital signs in last 24 hours: Temp:  [97.9 F (36.6 C)-98.7 F (37.1 C)] 98.6 F (37 C) (02/19 0630) Pulse Rate:  [76-93] 85 (02/19 0630) Resp:  [10-19] 18 (02/19 0630) BP: (135-194)/(76-117) 135/76 mmHg (02/19 0630) SpO2:  [99 %-100 %] 99 % (02/19 0630) Weight:  [95.255 kg (210 lb)] 95.255 kg (210 lb) (02/19 0138)  Intake/Output from previous day: 02/18 0701 - 02/19 0700 In: 1700 [P.O.:100; I.V.:1600] Out: 15 [Blood:15] Intake/Output this shift:     Recent Labs  03/08/13 0430  HGB 10.0*    Recent Labs  03/08/13 0430  WBC 10.5  RBC 3.99  HCT 30.5*  PLT 201    Recent Labs  03/08/13 0430  NA 138  K 4.1  CL 102  CO2 24  BUN 8  CREATININE 0.61  GLUCOSE 172*  CALCIUM 8.8   No results found for this basename: LABPT, INR,  in the last 72 hours  Neurologically intact ABD soft Neurovascular intact Sensation intact distally Intact pulses distally Dorsiflexion/Plantar flexion intact Compartment soft Scant bloody drainage noted through ace bandage  Assessment/Plan: 1 Day Post-Op Procedure(s) (LRB): LEFT TOTAL KNEE ARTHROPLASTY (Left) Advance diet Up with therapy D/C IV fluids Plan for discharge tomorrow Discharge home with home health Continue cipro due to pre-op UTI Dry dressing change PRN  ANTON, M. LINDSEY 03/08/2013, 7:11 AM

## 2013-03-08 NOTE — Progress Notes (Signed)
Physical Therapy Treatment Patient Details Name: Veronica Morton MRN: [id number] DOB: 1961/01/11 Today's Date: 03/08/2013 Time: 7672-0947 PT Time Calculation (min): 29 min  PT Assessment / Plan / Recommendation  History of Present Illness s/p L TKA   PT Comments   Pt able to progress mobility this afternoon and denies any dizziness or nausea. Pt also performed bed level exercsies and placed into CPM. Pt tolerating up to 65 degrees of flexion. Educated pt on wear schedule and on/off button of CPM. Pt verbalized understanding. Pt hopes to D/C home tomorrow. Will cont to follow per POC.   Follow Up Recommendations  Home health PT;Supervision/Assistance - 24 hour     Does the patient have the potential to tolerate intense rehabilitation     Barriers to Discharge        Equipment Recommendations  None recommended by PT    Recommendations for Other Services    Frequency 7X/week   Progress towards PT Goals Progress towards PT goals: Progressing toward goals  Plan Current plan remains appropriate    Precautions / Restrictions Precautions Precautions: Knee Precaution Booklet Issued: Yes (comment) Required Braces or Orthoses: Knee Immobilizer - Left Knee Immobilizer - Left: Other (comment) (until MD clarifies ) Restrictions Weight Bearing Restrictions: Yes LLE Weight Bearing: Weight bearing as tolerated   Pertinent Vitals/Pain 4/10; .repos    Mobility  Bed Mobility Overal bed mobility: Modified Independent Bed Mobility: Sit to Supine Sit to supine: Modified independent (Device/Increase time) General bed mobility comments: pt able to use hooklying technique to independently bring Lt LE into bed; requires incr time  Transfers Overall transfer level: Needs assistance Equipment used: Rolling walker (2 wheeled) Transfers: Sit to/from Stand Sit to Stand: Min guard General transfer comment: cues for sequencing and hand placement; pt tends to flop to sitting; encouragd to control  descent  Ambulation/Gait Ambulation/Gait assistance: Min guard Ambulation Distance (Feet): 110 Feet Assistive device: Rolling walker (2 wheeled) Gait Pattern/deviations: Decreased stance time - left;Decreased step length - right;Antalgic;Wide base of support;Trunk flexed Gait velocity: decreased Gait velocity interpretation: Below normal speed for age/gender General Gait Details: cues for upright posture and gt sequencing; encouraged to increase step length on Rt LE and complete heel strike on Lt LE; pt looks at ground when ambulating; cues for safety     Exercises Total Joint Exercises Ankle Circles/Pumps: AROM;Both;15 reps;Seated Quad Sets: AROM;10 reps;Left;Supine Short Arc Quad: AROM;Left;10 reps;Supine Straight Leg Raises: AROM;Left;10 reps;Supine   PT Diagnosis:    PT Problem List:   PT Treatment Interventions:     PT Goals (current goals can now be found in the care plan section) Acute Rehab PT Goals Patient Stated Goal: to go home tomorrow  PT Goal Formulation: With patient Time For Goal Achievement: 03/15/13 Potential to Achieve Goals: Good  Visit Information  Last PT Received On: 03/08/13 Assistance Needed: +2 History of Present Illness: s/p L TKA    Subjective Data  Subjective: pt in chair; states " i just rang for the nurse so  i could get back in bed so this is perfect"  Patient Stated Goal: to go home tomorrow    Cognition  Cognition Arousal/Alertness: Awake/alert Behavior During Therapy: WFL for tasks assessed/performed Overall Cognitive Status: Within Functional Limits for tasks assessed    Balance  Balance Overall balance assessment: Needs assistance Sitting-balance support: Feet supported;No upper extremity supported Sitting balance-Leahy Scale: Good Standing balance support: During functional activity;Bilateral upper extremity supported Standing balance-Leahy Scale: Fair Standing balance comment: min guard for safety  with UEs supported by RW  End  of Session PT - End of Session Equipment Utilized During Treatment: Gait belt;Left knee immobilizer Activity Tolerance: Patient tolerated treatment well Patient left: in bed;with call bell/phone within reach;in CPM Nurse Communication: Mobility status;Other (comment) (CPM wear schedule )   GP     Gustavus Bryant, [place] [phone] 03/08/2013, 5:35 PM

## 2013-03-08 NOTE — Progress Notes (Signed)
Dr. Percell Miller notified for uncontrolled pain management despite scheduled and PRN pain medications given. Orders received, pt began full dosed dilaudid PCA, and held oxy IR as well as scheduled norco. MD also notified pts dressing needing reinforcement d/t excessive drainage, states he will change dressing in a.m. Will continue to monitor.

## 2013-03-08 NOTE — Plan of Care (Signed)
Problem: Consults Goal: Diagnosis- Total Joint Replacement Primary Total Knee     

## 2013-03-08 NOTE — Discharge Instructions (Signed)
Total Knee Replacement Care After Refer to this sheet in the next few weeks. These discharge instructions provide you with general information on caring for yourself after you leave the hospital. Your caregiver may also give you specific instructions. Your treatment has been planned according to the most current medical practices available, but unavoidable complications sometimes occur. If you have any problems or questions after discharge, please call your caregiver. Regaining a near full range of motion of your knee within the first 3 to 6 weeks after surgery is critical. HOME CARE INSTRUCTIONS  Aspirin: 1 tab a day for the next 30 days to prevent blood clots You may resume a normal diet and activities as directed.  Perform exercises as directed.  Place gray foam block, curve side up under heel at all times except when in CPM or when walking.  DO NOT modify, tear, cut, or change in any way the gray foam block. You will receive physical therapy daily  Take showers instead of baths until informed otherwise.  You may shower on Sunday.  Please wash whole leg including wound with soap and water  Change bandages (dressings)daily It is OK to take over-the-counter tylenol in addition to the oxycodone for pain, discomfort, or fever. Oxycodone is VERY constipating.  Please take stool softener twice a day and laxatives daily until bowels are regular Eat a well-balanced diet.  Avoid lifting or driving until you are instructed otherwise.  Make an appointment to see your caregiver for stitches (suture) or staple removal as directed.  If you have been sent home with a continuous passive motion machine (CPM machine), 0-90 degrees 6 hrs a day   2 hrs a shift SEEK MEDICAL CARE IF: You have swelling of your calf or leg.  You develop shortness of breath or chest pain.  You have redness, swelling, or increasing pain in the wound.  There is pus or any unusual drainage coming from the surgical site.  You notice a  bad smell coming from the surgical site or dressing.  The surgical site breaks open after sutures or staples have been removed.  There is persistent bleeding from the suture or staple line.  You are getting worse or are not improving.  You have any other questions or concerns.  SEEK IMMEDIATE MEDICAL CARE IF:  You have a fever.  You develop a rash.  You have difficulty breathing.  You develop any reaction or side effects to medicines given.  Your knee motion is decreasing rather than improving.  MAKE SURE YOU:  Understand these instructions.  Will watch your condition.  Will get help right away if you are not doing well or get worse.   Home Health physical therapy to be provided by [facility] [phone]

## 2013-03-09 ENCOUNTER — Encounter (HOSPITAL_COMMUNITY): Payer: Self-pay | Admitting: Orthopedic Surgery

## 2013-03-09 LAB — CBC
HCT: 27.3 % — ABNORMAL LOW (ref 36.0–46.0)
Hemoglobin: 8.9 g/dL — ABNORMAL LOW (ref 12.0–15.0)
MCH: 25.4 pg — ABNORMAL LOW (ref 26.0–34.0)
MCHC: 32.6 g/dL (ref 30.0–36.0)
MCV: 77.8 fL — ABNORMAL LOW (ref 78.0–100.0)
Platelets: 205 10*3/uL (ref 150–400)
RBC: 3.51 MIL/uL — ABNORMAL LOW (ref 3.87–5.11)
RDW: 14.4 % (ref 11.5–15.5)
WBC: 14 10*3/uL — ABNORMAL HIGH (ref 4.0–10.5)

## 2013-03-09 LAB — BASIC METABOLIC PANEL
BUN: 9 mg/dL (ref 6–23)
CO2: 25 mEq/L (ref 19–32)
Calcium: 8.9 mg/dL (ref 8.4–10.5)
Chloride: 106 mEq/L (ref 96–112)
Creatinine, Ser: 0.58 mg/dL (ref 0.50–1.10)
Glucose, Bld: 135 mg/dL — ABNORMAL HIGH (ref 70–99)
POTASSIUM: 4.3 meq/L (ref 3.7–5.3)
SODIUM: 142 meq/L (ref 137–147)

## 2013-03-09 NOTE — Progress Notes (Signed)
Occupational Therapy Treatment Patient Details Name: Veronica Morton MRN: [id number] DOB: May 22, 1960 Today's Date: 03/09/2013 Time: 0920-0939 OT Time Calculation (min): 19 min  OT Assessment / Plan / Recommendation  History of present illness s/p L TKA   OT comments  Pt has met goals. Anticipate d/c home later today.  Follow Up Recommendations  No OT follow up;Supervision/Assistance - 24 hour    Barriers to Discharge       Equipment Recommendations  None recommended by OT    Recommendations for Other Services    Frequency Min 2X/week   Progress towards OT Goals Progress towards OT goals: Goals met/education completed, patient discharged from [place] goals met and education completed, patient discharged from OT services;Discharge plan remains appropriate    Precautions / Restrictions Precautions Precautions: Knee Required Braces or Orthoses: Knee Immobilizer - Left Restrictions Weight Bearing Restrictions: Yes LLE Weight Bearing: Weight bearing as tolerated   Pertinent Vitals/Pain See vitals    ADL  Grooming: Performed;Modified independent Where Assessed - Grooming: Unsupported standing Upper Body Bathing: Performed;Set up Where Assessed - Upper Body Bathing: Unsupported sitting Lower Body Bathing: Simulated;Set up Where Assessed - Lower Body Bathing: Unsupported sit to stand Upper Body Dressing: Performed;Set up Where Assessed - Upper Body Dressing: Unsupported sitting Lower Body Dressing: Performed;Set up Where Assessed - Lower Body Dressing: Unsupported sit to stand Toilet Transfer: Simulated;Supervision/safety Toilet Transfer Method: Sit to Loss adjuster, chartered:  (bed>recliner) Toileting - Clothing Manipulation and Hygiene: Simulated;Modified independent Where Assessed - Toileting Clothing Manipulation and Hygiene: Sit to stand from 3-in-1 or toilet Equipment Used: Gait belt;Knee Immobilizer;Rolling walker Transfers/Ambulation Related to ADLs:  Mod I sit<>stand. Supervision ambulating in room with RW. ADL Comments: Pt able to complete LB dressing tasks in long sitting position in bed.  She reports she does have reacher at home if needed.  Demo's good safety awareness during all mobility tasks.    OT Diagnosis:    OT Problem List:   OT Treatment Interventions:     OT Goals(current goals can now be found in the care plan section) Acute Rehab OT Goals Patient Stated Goal: to go home tomorrow  OT Goal Formulation: With patient Time For Goal Achievement: 03/15/13 Potential to Achieve Goals: Good ADL Goals Pt Will Perform Grooming: with modified independence;standing Pt Will Transfer to Toilet: with supervision;ambulating Pt Will Perform Toileting - Clothing Manipulation and hygiene: with modified independence;sit to/from stand Additional ADL Goal #1: Pt will be able to independently demonstrate use of AE for LB ADLS.  Visit Information  Last OT Received On: 03/09/13 Assistance Needed: +1 History of Present Illness: s/p L TKA    Subjective Data      Prior Functioning       Cognition  Cognition Arousal/Alertness: Awake/alert Behavior During Therapy: WFL for tasks assessed/performed Overall Cognitive Status: Within Functional Limits for tasks assessed    Mobility  Bed Mobility Overal bed mobility: Modified Independent Transfers Overall transfer level: Modified independent General transfer comment: incr time for sit<>stand, but independently demonstrating correct hand placement and technique    Exercises      Balance    End of Session OT - End of Session Equipment Utilized During Treatment: Gait belt;Rolling walker;Left knee immobilizer Activity Tolerance: Patient tolerated treatment well Patient left: in chair;with call bell/phone within reach Nurse Communication: Mobility status  GO    03/09/2013 Darrol Jump OTR/L Pager [phone] Office [phone]  Darrol Jump 03/09/2013, 10:18 AM

## 2013-03-09 NOTE — Progress Notes (Signed)
Physical Therapy Treatment Patient Details Name: Veronica Morton MRN: [id number] DOB: 1960-09-23 Today's Date: 03/09/2013 Time: 1962-2297 PT Time Calculation (min): 35 min  PT Assessment / Plan / Recommendation  History of Present Illness s/p L TKA   PT Comments   Pt. With much improvement in functional mobility since yesterday pm session.  ROM and strength coming along nicely.  Has met acute PT goals and has DC orders for home today.  Needs HHPT follow up .  Follow Up Recommendations  Home health PT;Supervision/Assistance - 24 hour     Does the patient have the potential to tolerate intense rehabilitation     Barriers to Discharge        Equipment Recommendations  None recommended by PT    Recommendations for Other Services    Frequency 7X/week   Progress towards PT Goals Progress towards PT goals: Goals met/education completed, patient discharged from PT  Plan Current plan remains appropriate    Precautions / Restrictions Precautions Precautions: Knee Required Braces or Orthoses: Knee Immobilizer - Left Knee Immobilizer - Left: Other (comment) (until DC'd per orders) Restrictions Weight Bearing Restrictions: Yes LLE Weight Bearing: Weight bearing as tolerated   Pertinent Vitals/Pain See vitals tab     Mobility  Bed Mobility Overal bed mobility:  (not tested, pt. in recliner) Transfers Overall transfer level: Modified independent Equipment used: Rolling walker (2 wheeled) Transfers: Sit to/from Stand Sit to Stand: Modified independent (Device/Increase time) General transfer comment: pt. managing sit<>stand transition well with slight increase in time needed , overall at mod I level Ambulation/Gait Ambulation/Gait assistance: Modified independent (Device/Increase time) Ambulation Distance (Feet): 125 Feet Assistive device: Rolling walker (2 wheeled) Gait Pattern/deviations: Step-to pattern;Decreased stance time - left;Decreased step length - right;Decreased step  length - left Gait velocity:  improving compared to yesterday General Gait Details: cues for full heel strike on L, looking up better today    Exercises Total Joint Exercises Ankle Circles/Pumps: AROM;Both;15 reps;Seated Quad Sets: AROM;Left;10 reps Short Arc Quad: AROM;Left;10 reps Straight Leg Raises: AROM;Left;10 reps Long Arc Quad: AROM;Left;10 reps Knee Flexion: AROM;Left;5 reps;Seated Goniometric ROM:  0 to 90 per goniometer   PT Diagnosis:    PT Problem List:   PT Treatment Interventions:     PT Goals (current goals can now be found in the care plan section) Acute Rehab PT Goals Patient Stated Goal: to go home tomorrow   Visit Information  Last PT Received On: 03/09/13 Assistance Needed: +1 History of Present Illness: s/p L TKA    Subjective Data  Subjective: Pt. reports she is going home this am after her therapies.  She states she believes the exercises we worked on this am will help her to gain back her strength and ROM.  She is pleased with her progress since yesterday and is feeling good about going homel. Patient Stated Goal: to go home tomorrow    Cognition  Cognition Arousal/Alertness: Awake/alert Behavior During Therapy: WFL for tasks assessed/performed Overall Cognitive Status: Within Functional Limits for tasks assessed    Balance     End of Session PT - End of Session Equipment Utilized During Treatment: Gait belt;Left knee immobilizer Activity Tolerance: Patient tolerated treatment well Patient left: in chair;with call bell/phone within reach Nurse Communication: Mobility status   GP     Ladona Ridgel 03/09/2013, 11:48 AM Gerlean Ren PT Acute Rehab Services [place] [phone]

## 2013-03-09 NOTE — Plan of Care (Signed)
Problem: Acute Rehab OT Goals (only OT should resolve) Goal: OT Additional ADL Goal #1 Outcome: Not Applicable Date Met:  04/54/09 Pt able to complete LB ADLs without use of AE.  Comments:  Goal no longer needed. Pt able to complete LB ADLs without use of AE.

## 2013-03-09 NOTE — Progress Notes (Signed)
Subjective: 2 Days Post-Op Procedure(s) (LRB): LEFT TOTAL KNEE ARTHROPLASTY (Left) Patient reports pain as 2 on 0-10 scale.  Doing very well with pain control since being placed on dilaudid pca.  Some nausea and vomiting yesterday, but none today.  Positive flatus, no bm as of yet.  Tolerating diet well.  Objective: Vital signs in last 24 hours: Temp:  [95.5 F (35.3 C)-98.5 F (36.9 C)] 98.5 F (36.9 C) (02/20 0557) Pulse Rate:  [85-95] 95 (02/20 0557) Resp:  [18-20] 18 (02/20 0557) BP: (122-135)/(64-72) 135/64 mmHg (02/20 0557) SpO2:  [97 %-100 %] 99 % (02/20 0557)  Intake/Output from previous day: 02/19 0701 - 02/20 0700 In: 1320 [P.O.:1320] Out: -  Intake/Output this shift:     Recent Labs  03/08/13 0430 03/09/13 0445  HGB 10.0* 8.9*    Recent Labs  03/08/13 0430 03/09/13 0445  WBC 10.5 14.0*  RBC 3.99 3.51*  HCT 30.5* 27.3*  PLT 201 205    Recent Labs  03/08/13 0430 03/09/13 0445  NA 138 142  K 4.1 4.3  CL 102 106  CO2 24 25  BUN 8 9  CREATININE 0.61 0.58  GLUCOSE 172* 135*  CALCIUM 8.8 8.9   No results found for this basename: LABPT, INR,  in the last 72 hours  Neurologically intact ABD soft Neurovascular intact Sensation intact distally Intact pulses distally Dorsiflexion/Plantar flexion intact Incision: moderate drainage No cellulitis present Compartment soft Dressing changed by me today  Assessment/Plan: 2 Days Post-Op Procedure(s) (LRB): LEFT TOTAL KNEE ARTHROPLASTY (Left) Advance diet Up with therapy Discharge home with home health  [place], M. Veronica Morton 03/09/2013, 7:52 AM

## 2013-03-23 ENCOUNTER — Telehealth: Payer: Self-pay | Admitting: Neurology

## 2013-03-23 NOTE — Telephone Encounter (Signed)
Called patient in an attempt to r/s her appointment from 01/22/13, patient stated "no" and she will call back if she wants to schedule it.

## 2013-11-26 LAB — HM PAP SMEAR: HM Pap smear: NORMAL

## 2013-12-19 LAB — LIPID PANEL
CHOLESTEROL: 180 mg/dL (ref 0–200)
HDL: 43 mg/dL (ref 35–70)
LDL CALC: 110 mg/dL
Triglycerides: 137 mg/dL (ref 40–160)

## 2014-02-20 ENCOUNTER — Ambulatory Visit: Payer: Self-pay | Admitting: Family Medicine

## 2014-02-20 LAB — HM MAMMOGRAPHY: HM Mammogram: NORMAL

## 2014-08-29 ENCOUNTER — Encounter: Payer: Self-pay | Admitting: Family Medicine

## 2014-08-29 ENCOUNTER — Ambulatory Visit (INDEPENDENT_AMBULATORY_CARE_PROVIDER_SITE_OTHER): Payer: 59 | Admitting: Family Medicine

## 2014-08-29 VITALS — BP 136/74 | HR 96 | Temp 97.6°F | Resp 18 | Ht 62.0 in | Wt 208.0 lb

## 2014-08-29 DIAGNOSIS — Z139 Encounter for screening, unspecified: Secondary | ICD-10-CM | POA: Diagnosis not present

## 2014-08-29 DIAGNOSIS — Z9889 Other specified postprocedural states: Secondary | ICD-10-CM | POA: Insufficient documentation

## 2014-08-29 DIAGNOSIS — E669 Obesity, unspecified: Secondary | ICD-10-CM | POA: Diagnosis not present

## 2014-08-29 DIAGNOSIS — E785 Hyperlipidemia, unspecified: Secondary | ICD-10-CM | POA: Diagnosis not present

## 2014-08-29 DIAGNOSIS — Z87898 Personal history of other specified conditions: Secondary | ICD-10-CM

## 2014-08-29 DIAGNOSIS — Z23 Encounter for immunization: Secondary | ICD-10-CM | POA: Diagnosis not present

## 2014-08-29 DIAGNOSIS — Z862 Personal history of diseases of the blood and blood-forming organs and certain disorders involving the immune mechanism: Secondary | ICD-10-CM | POA: Diagnosis not present

## 2014-08-29 DIAGNOSIS — Z780 Asymptomatic menopausal state: Secondary | ICD-10-CM | POA: Insufficient documentation

## 2014-08-29 DIAGNOSIS — E8881 Metabolic syndrome: Secondary | ICD-10-CM | POA: Diagnosis not present

## 2014-08-29 DIAGNOSIS — R790 Abnormal level of blood mineral: Secondary | ICD-10-CM

## 2014-08-29 MED ORDER — SCOPOLAMINE 1 MG/3DAYS TD PT72: 1.0000 | MEDICATED_PATCH | TRANSDERMAL | Status: DC

## 2014-08-29 NOTE — Progress Notes (Signed)
Name: Veronica Morton   MRN: [id number]    DOB: 09-26-1960   Date:08/29/2014       Progress Note  Subjective  Chief Complaint  Chief Complaint  Patient presents with  . Nausea    Patient is going on a cruise and would like some medication to help with it. Due to experiencing motion sickness on smaller boats in the past.    HPI  History of motion sickness: she would like to get scopolamine patches to avoid symptoms while on a cruise this upcoming week  Dyslipidemia: she is on dietary modification only  Metabolic Syndrome: last VELF8B was 5.8%, she denies polyphagia, polydipsia or polyuria  History of anemia: she stopped donating blood, we will recheck labs  Perimenopausal : LMP 04/2014 , she has hot flashes, night sweats, weight gain, no fatigue, no mood swings.  Does not want medication for symptoms  Obesity: she joined weight watchers in 2015 but only lost 4 lbs in 7 months. She is going to the gym four days weekly, also replaced breakfast with protein shakes, she counts calories and eats 1800 calories daily on average    Patient Active Problem List   Diagnosis Date Noted  . Dyslipidemia 08/29/2014  . H/O knee surgery 08/29/2014  . Perimenopause 08/29/2014  . History of anemia 08/29/2014  . Dysmetabolic syndrome 01/75/1025  . Decreased ferritin 08/29/2014  . Obesity (BMI 30-39.9) 08/29/2014  . 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 History of pneumonia 12/28/2012    Past Surgical History  Procedure Laterality Date  . Tubal ligation    . Appendectomy    . Total knee arthroplasty  08/04/2011    Procedure: TOTAL KNEE ARTHROPLASTY;  Surgeon: Ninetta Lights, MD;  Location: [facility];  Service: Orthopedics;  Laterality: Right;  . Joint replacement    . Total knee arthroplasty Left 03/07/2013    Procedure: LEFT TOTAL KNEE ARTHROPLASTY;  Surgeon: Ninetta Lights, MD;  Location: [facility];  Service: Orthopedics;  Laterality: Left;    Family History  Problem Relation  Age of Onset  . Rectal cancer Father   . Cancer Father     Rectal  . Diabetes Father   . Colon cancer Paternal Grandmother   . Cancer Mother     Breast  . Diabetes Mother   . Diabetes Sister   . Hypertension Brother     Social History   Social History  . Marital Status: Single    Spouse Name: N/A  . Number of Children: N/A  . Years of Education: N/A   Occupational History  . Not on file.   Social History Main Topics  . Smoking status: Never Smoker   . Smokeless tobacco: Never Used  . Alcohol Use: 0.0 oz/week    0 Standard drinks or equivalent per week     Comment: occ  . Drug Use: No  . Sexual Activity: Not Currently   Other Topics Concern  . Not on file   Social History Narrative    No current outpatient prescriptions on file.  No Known Allergies   ROS  Constitutional: Negative for fever positive for  weight change.  Respiratory: Negative for cough and shortness of breath.   Cardiovascular: Negative for chest pain or palpitations.  Gastrointestinal: Negative for abdominal pain, no bowel changes.  Musculoskeletal: Negative for gait problem or joint swelling.  Skin: Negative for rash.  Neurological: Negative for dizziness or headache.  No other specific complaints in a complete review of  systems (except as listed in HPI above).  Objective  Filed Vitals:   08/29/14 1515  BP: 136/74  Pulse: 96  Temp: 97.6 F (36.4 C)  TempSrc: Oral  Resp: 18  Height: 5\' 2"  (1.575 m)  Weight: 208 lb (94.348 kg)  SpO2: 96%    Body mass index is 38.03 kg/(m^2).  Physical Exam  Constitutional: Patient appears well-developed and well-nourished. Obese  No distress.  HEENT: head atraumatic, normocephalic, pupils equal and reactive to light, neck supple, throat within normal limits Cardiovascular: Normal rate, regular rhythm and normal heart sounds.  No murmur heard. No BLE edema. Pulmonary/Chest: Effort normal and breath sounds normal. No respiratory  distress. Abdominal: Soft.  There is no tenderness. Psychiatric: Patient has a normal mood and affect. behavior is normal. Judgment and thought content normal.  PHQ2/9: Depression screen PHQ 2/9 08/29/2014  Decreased Interest 0  Down, Depressed, Hopeless 0  PHQ - 2 Score 0     Fall Risk: Fall Risk  08/29/2014  Falls in the past year? No      Assessment & Plan  1. Dyslipidemia  - Comprehensive metabolic panel - Lipid panel  2. Decreased ferritin Recheck labs  3. Dysmetabolic syndrome Discussed diet and exercise - Hemoglobin A1c  4. Obesity (BMI 30-39.9)  - TSH  5. History of anemia  - Vitamin B12 - CBC with Differential/Platelet - Iron and TIBC - Ferritin  6. Flu vaccine need  - Flu Vaccine QUAD 36+ mos IM  7. Screening  - Nicotine/cotinine metabolites  8. H/O motion sickness  - scopolamine (TRANSDERM-SCOP) 1 MG/3DAYS; Place 1 patch (1.5 mg total) onto the skin every 3 (three) days.  Dispense: 10 patch; Refill: 0  9. Need for hepatitis A immunization  - Hepatitis A vaccine adult IM

## 2014-09-03 ENCOUNTER — Ambulatory Visit: Payer: Self-pay | Admitting: Family Medicine

## 2014-09-03 LAB — COMPREHENSIVE METABOLIC PANEL
A/G RATIO: 2.4 (ref 1.1–2.5)
ALK PHOS: 79 IU/L (ref 39–117)
ALT: 22 IU/L (ref 0–32)
AST: 22 IU/L (ref 0–40)
Albumin: 4.4 g/dL (ref 3.5–5.5)
BUN/Creatinine Ratio: 15 (ref 9–23)
BUN: 10 mg/dL (ref 6–24)
Bilirubin Total: 1.2 mg/dL (ref 0.0–1.2)
CALCIUM: 9.7 mg/dL (ref 8.7–10.2)
CO2: 26 mmol/L (ref 18–29)
CREATININE: 0.68 mg/dL (ref 0.57–1.00)
Chloride: 105 mmol/L (ref 97–108)
GFR calc Af Amer: 117 mL/min/{1.73_m2} (ref >59)
GFR, EST NON AFRICAN AMERICAN: 102 mL/min/{1.73_m2} (ref >59)
GLOBULIN, TOTAL: 1.8 g/dL (ref 1.5–4.5)
Glucose: 99 mg/dL (ref 65–99)
POTASSIUM: 4.6 mmol/L (ref 3.5–5.2)
Sodium: 142 mmol/L (ref 134–144)
Total Protein: 6.2 g/dL (ref 6.0–8.5)

## 2014-09-03 LAB — CBC WITH DIFFERENTIAL/PLATELET
BASOS ABS: 0 10*3/uL (ref 0.0–0.2)
Basos: 0 %
EOS (ABSOLUTE): 0.2 10*3/uL (ref 0.0–0.4)
Eos: 4 %
HEMATOCRIT: 42.7 % (ref 34.0–46.6)
Hemoglobin: 15 g/dL (ref 11.1–15.9)
LYMPHS: 28 %
Lymphocytes Absolute: 1.6 10*3/uL (ref 0.7–3.1)
MCH: 29.1 pg (ref 26.6–33.0)
MCHC: 35.1 g/dL (ref 31.5–35.7)
MCV: 83 fL (ref 79–97)
MONOCYTES: 6 %
Monocytes Absolute: 0.4 10*3/uL (ref 0.1–0.9)
Neutrophils Absolute: 3.7 10*3/uL (ref 1.4–7.0)
Neutrophils: 62 %
Platelets: 196 10*3/uL (ref 150–379)
RBC: 5.15 x10E6/uL (ref 3.77–5.28)
RDW: 13.8 % (ref 12.3–15.4)
WBC: 6 10*3/uL (ref 3.4–10.8)

## 2014-09-04 LAB — LIPID PANEL
CHOL/HDL RATIO: 4.4 ratio (ref 0.0–4.4)
Cholesterol, Total: 189 mg/dL (ref 100–199)
HDL: 43 mg/dL (ref >39)
LDL Calculated: 125 mg/dL — ABNORMAL HIGH (ref 0–99)
TRIGLYCERIDES: 104 mg/dL (ref 0–149)
VLDL Cholesterol Cal: 21 mg/dL (ref 5–40)

## 2014-09-04 LAB — IRON AND TIBC
IRON SATURATION: 22 % (ref 15–55)
Iron: 87 ug/dL (ref 27–159)
Total Iron Binding Capacity: 393 ug/dL (ref 250–450)
UIBC: 306 ug/dL (ref 131–425)

## 2014-09-04 LAB — HEMOGLOBIN A1C
Est. average glucose Bld gHb Est-mCnc: 108 mg/dL
Hgb A1c MFr Bld: 5.4 % (ref 4.8–5.6)

## 2014-09-04 LAB — TSH: TSH: 0.837 u[IU]/mL (ref 0.450–4.500)

## 2014-09-04 LAB — FERRITIN: Ferritin: 36 ng/mL (ref 15–150)

## 2014-09-04 LAB — VITAMIN B12: Vitamin B-12: 446 pg/mL (ref 211–946)

## 2014-09-04 NOTE — Progress Notes (Signed)
Patient notified

## 2014-09-05 NOTE — Progress Notes (Signed)
Pt.notified

## 2014-09-07 LAB — NICOTINE/COTININE METABOLITES
Cotinine: 1.4 ng/mL
NICOTINE: NOT DETECTED ng/mL

## 2014-09-09 NOTE — Progress Notes (Signed)
Patient notified

## 2014-11-29 ENCOUNTER — Encounter: Payer: Self-pay | Admitting: Family Medicine

## 2014-12-20 DIAGNOSIS — C441192 Basal cell carcinoma of skin of left lower eyelid, including canthus: Secondary | ICD-10-CM

## 2014-12-20 HISTORY — DX: Basal cell carcinoma of skin of left lower eyelid, including canthus: C44.1192

## 2014-12-30 ENCOUNTER — Other Ambulatory Visit: Payer: Self-pay

## 2014-12-30 DIAGNOSIS — Z87898 Personal history of other specified conditions: Secondary | ICD-10-CM

## 2014-12-30 MED ORDER — SCOPOLAMINE 1 MG/3DAYS TD PT72: 1.0000 | MEDICATED_PATCH | TRANSDERMAL | Status: DC

## 2014-12-30 NOTE — Telephone Encounter (Signed)
Going on cruise would like refill

## 2015-01-19 DIAGNOSIS — C801 Malignant (primary) neoplasm, unspecified: Secondary | ICD-10-CM

## 2015-01-19 HISTORY — DX: Malignant (primary) neoplasm, unspecified: C80.1

## 2015-01-22 DIAGNOSIS — Z808 Family history of malignant neoplasm of other organs or systems: Secondary | ICD-10-CM | POA: Insufficient documentation

## 2015-01-22 HISTORY — PX: HEAD & NECK SKIN LESION EXCISIONAL BIOPSY: SUR472

## 2015-03-03 ENCOUNTER — Encounter: Payer: Self-pay | Admitting: Family Medicine

## 2015-03-03 ENCOUNTER — Ambulatory Visit (INDEPENDENT_AMBULATORY_CARE_PROVIDER_SITE_OTHER): Payer: 59 | Admitting: Family Medicine

## 2015-03-03 ENCOUNTER — Ambulatory Visit: Payer: 59

## 2015-03-03 VITALS — BP 128/74 | HR 90 | Temp 98.1°F | Resp 18 | Ht 62.0 in | Wt 212.8 lb

## 2015-03-03 DIAGNOSIS — Z1211 Encounter for screening for malignant neoplasm of colon: Secondary | ICD-10-CM

## 2015-03-03 DIAGNOSIS — E669 Obesity, unspecified: Secondary | ICD-10-CM

## 2015-03-03 DIAGNOSIS — Z01419 Encounter for gynecological examination (general) (routine) without abnormal findings: Secondary | ICD-10-CM

## 2015-03-03 DIAGNOSIS — Z124 Encounter for screening for malignant neoplasm of cervix: Secondary | ICD-10-CM

## 2015-03-03 DIAGNOSIS — Z0000 Encounter for general adult medical examination without abnormal findings: Secondary | ICD-10-CM

## 2015-03-03 DIAGNOSIS — B372 Candidiasis of skin and nail: Secondary | ICD-10-CM | POA: Insufficient documentation

## 2015-03-03 DIAGNOSIS — Z808 Family history of malignant neoplasm of other organs or systems: Secondary | ICD-10-CM

## 2015-03-03 DIAGNOSIS — Z1239 Encounter for other screening for malignant neoplasm of breast: Secondary | ICD-10-CM

## 2015-03-03 MED ORDER — NALTREXONE-BUPROPION HCL ER 8-90 MG PO TB12: 2.0000 | ORAL_TABLET | Freq: Two times a day (BID) | ORAL | Status: DC

## 2015-03-03 NOTE — Progress Notes (Signed)
Name: Veronica Morton   MRN: [id number]    DOB: 04/25/1960   Date:03/03/2015       Progress Note  Subjective  Chief Complaint  Chief Complaint  Patient presents with  . Annual Exam    HPI   Well Woman: perimenopause. LMP 04/2014. She states the hot flashes and night sweats have improved. She still has breast tenderness, she has noticed vaginal dryness.   Obesity: she started to have weight problems after her 3rd child, around age 55 yo. She exercises 5 days week for about 1 hour. Packs her lunch for work, eats a healthy breakfast, drinks only water, and cooks dinner at home. Frustrated for not losing weight. She also states her weight fluctuates. Maximum weight of 220lbs   Patient Active Problem List   Diagnosis Date Noted  . Dyslipidemia 08/29/2014  . H/O knee surgery 08/29/2014  . Perimenopause 08/29/2014  . History of anemia 08/29/2014  . Dysmetabolic syndrome [id number]  . Decreased ferritin 08/29/2014  . Obesity (BMI 30-39.9) 08/29/2014  . 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 History of pneumonia 12/28/2012    Past Surgical History  Procedure Laterality Date  . Tubal ligation    . Appendectomy    . Total knee arthroplasty  08/04/2011    Procedure: TOTAL KNEE ARTHROPLASTY;  Surgeon: Ninetta Lights, MD;  Location: [facility];  Service: Orthopedics;  Laterality: Right;  . Joint replacement    . Total knee arthroplasty Left 03/07/2013    Procedure: LEFT TOTAL KNEE ARTHROPLASTY;  Surgeon: Ninetta Lights, MD;  Location: [facility];  Service: Orthopedics;  Laterality: Left;  . Head & neck skin lesion excisional biopsy  01/22/2015    Dr. Grace Bushy at [facility]    Family History  Problem Relation Age of Onset  . Rectal cancer Father   . Cancer Father     Rectal  . Diabetes Father   . Colon cancer Paternal Grandmother   . Cancer Mother     Breast  . Diabetes Mother   . Diabetes Sister   . Hypertension Brother     Social History   Social  History  . Marital Status: Single    Spouse Name: N/A  . Number of Children: N/A  . Years of Education: N/A   Occupational History  . Not on file.   Social History Main Topics  . Smoking status: Never Smoker   . Smokeless tobacco: Never Used  . Alcohol Use: 0.0 oz/week    0 Standard drinks or equivalent per week     Comment: occassionally-social  . Drug Use: No  . Sexual Activity: Not Currently   Other Topics Concern  . Not on file   Social History Narrative     Current outpatient prescriptions:  .  valACYclovir (VALTREX) 1000 MG tablet, Take 1,000 mg by mouth 2 (two) times daily. Takes 1 pill bid prn fever blisters, Disp: , Rfl:  .  Naltrexone-Bupropion HCl ER (CONTRAVE) 8-90 MG TB12, Take 2 tablets by mouth 2 (two) times daily., Disp: 120 tablet, Rfl: 0  No Known Allergies   ROS  Constitutional: Negative for fever , positive for  weight change.  Respiratory: Negative for cough and shortness of breath.   Cardiovascular: Negative for chest pain or palpitations.  Gastrointestinal: Negative for abdominal pain, no bowel changes.  Musculoskeletal: Negative for gait problem or joint swelling.  Skin: Negative for rash.  Neurological: Negative for dizziness or headache.  No other specific complaints in a  complete review of systems (except as listed in HPI above).  Objective  Filed Vitals:   03/03/15 1113  BP: 128/74  Pulse: 90  Temp: 98.1 F (36.7 C)  TempSrc: Oral  Resp: 18  Height: 5\' 2"  (1.575 m)  Weight: 212 lb 12.8 oz (96.525 kg)  SpO2: 98%    Body mass index is 38.91 kg/(m^2).  Physical Exam  Constitutional: Patient appears well-developed and well-nourished. No distress.  HENT: Head: Normocephalic and atraumatic. Ears: B TMs ok, no erythema or effusion; Nose: Nose normal. Mouth/Throat: Oropharynx is clear and moist. No oropharyngeal exudate.  Eyes: Conjunctivae and EOM are normal. Pupils are equal, round, and reactive to light. No scleral icterus.   Neck: Normal range of motion. Neck supple. No JVD present. No thyromegaly present.  Cardiovascular: Normal rate, regular rhythm and normal heart sounds.  No murmur heard. No BLE edema. Pulmonary/Chest: Effort normal and breath sounds normal. No respiratory distress. Abdominal: Soft. Bowel sounds are normal, no distension. There is no tenderness. no masses Breast: no lumps or masses, no nipple discharge or rashes FEMALE GENITALIA:  External genitalia normal External urethra normal No pelvic exam RECTAL: not done Musculoskeletal: Normal range of motion, no joint effusions. No gross deformities Neurological: he is alert and oriented to person, place, and time. No cranial nerve deficit. Coordination, balance, strength, speech and gait are normal.  Skin: Skin is warm and dry. Erythematous rash with satellite lesions under both breasts Psychiatric: Patient has a normal mood and affect. behavior is normal. Judgment and thought content normal.  PHQ2/9: Depression screen [facility] 2/9 03/03/2015 08/29/2014  Decreased Interest 0 0  Down, Depressed, Hopeless 0 0  PHQ - 2 Score 0 0     Fall Risk: Fall Risk  03/03/2015 08/29/2014  Falls in the past year? No No    Functional Status Survey: Is the patient deaf or have difficulty hearing?: No Does the patient have difficulty seeing, even when wearing glasses/contacts?: No Does the patient have difficulty concentrating, remembering, or making decisions?: No Does the patient have difficulty walking or climbing stairs?: No Does the patient have difficulty dressing or bathing?: No Does the patient have difficulty doing errands alone such as visiting a doctor's office or shopping?: No    Assessment & Plan  1. Well woman exam  Discussed importance of 150 minutes of physical activity weekly, eat two servings of fish weekly, eat one serving of tree nuts ( cashews, pistachios, pecans, almonds.Marland Kitchen) every other day, eat 6 servings of fruit/vegetables daily and  drink plenty of water and avoid sweet beverages.   2. Cervical cancer screening  Not done, discussed USPTF, repeat in 2018  3. Breast cancer screening  - MM Digital Screening; Future  4. Colon cancer screening  Up to date  90. Obesity (BMI 30-39.9)  Discussed all optiosns for weight loss medications including Belviq, Qsymia, Saxenda and Contrave. Discussed risk and benefits of each of them. - Naltrexone-Bupropion HCl ER (CONTRAVE) 8-90 MG TB12; Take 2 tablets by mouth 2 (two) times daily.  Dispense: 120 tablet; Refill: 0   6. History of skin cancer of unknown type  We will obtain records  7. Candidal intertrigo  Advised otc medication, clotrimazole plus hydrocortisone prn

## 2015-03-03 NOTE — Patient Instructions (Signed)
Try Clotrimazole 1% cream mixed with Hydrocortisone 1 % on rash under breast

## 2015-03-06 ENCOUNTER — Ambulatory Visit
Admission: RE | Admit: 2015-03-06 | Discharge: 2015-03-06 | Disposition: A | Discharge status: Home / Self Care | Payer: 59 | Source: Ambulatory Visit | Attending: Family Medicine | Admitting: Family Medicine

## 2015-03-06 DIAGNOSIS — Z1231 Encounter for screening mammogram for malignant neoplasm of breast: Secondary | ICD-10-CM | POA: Insufficient documentation

## 2015-03-06 DIAGNOSIS — Z1239 Encounter for other screening for malignant neoplasm of breast: Secondary | ICD-10-CM

## 2015-03-06 HISTORY — DX: Malignant (primary) neoplasm, unspecified: C80.1

## 2015-03-07 ENCOUNTER — Telehealth: Payer: Self-pay | Admitting: Family Medicine

## 2015-03-07 ENCOUNTER — Other Ambulatory Visit: Payer: Self-pay | Admitting: Family Medicine

## 2015-03-07 DIAGNOSIS — R928 Other abnormal and inconclusive findings on diagnostic imaging of breast: Secondary | ICD-10-CM

## 2015-03-07 NOTE — Telephone Encounter (Signed)
Called pt and had to leave a message, Veronica Morton has been approved by her insurance company.

## 2015-03-07 NOTE — Telephone Encounter (Signed)
Notified patient and pharmacy Contrave was approved

## 2015-03-07 NOTE — Telephone Encounter (Signed)
Was prescribed Contrave on Monday and was told that it is needing authorization. Patient is checking status on this.

## 2015-03-14 ENCOUNTER — Ambulatory Visit
Admission: RE | Admit: 2015-03-14 | Discharge: 2015-03-14 | Disposition: A | Discharge status: Home / Self Care | Payer: 59 | Source: Ambulatory Visit | Attending: Family Medicine | Admitting: Family Medicine

## 2015-03-14 DIAGNOSIS — R928 Other abnormal and inconclusive findings on diagnostic imaging of breast: Secondary | ICD-10-CM

## 2015-03-14 DIAGNOSIS — N6489 Other specified disorders of breast: Secondary | ICD-10-CM | POA: Diagnosis present

## 2015-03-14 DIAGNOSIS — N63 Unspecified lump in breast: Secondary | ICD-10-CM | POA: Insufficient documentation

## 2015-03-17 ENCOUNTER — Telehealth: Payer: Self-pay

## 2015-03-17 ENCOUNTER — Other Ambulatory Visit: Payer: Self-pay | Admitting: Family Medicine

## 2015-03-17 DIAGNOSIS — R928 Other abnormal and inconclusive findings on diagnostic imaging of breast: Secondary | ICD-10-CM

## 2015-03-17 NOTE — Telephone Encounter (Signed)
Pt called asking about her abnormal mammogram says some one called her Friday.  If she needs to be referred out, she'd like to go Dr. Johnathan Hausen at [facility] in [place]. Thanks

## 2015-04-14 ENCOUNTER — Ambulatory Visit (INDEPENDENT_AMBULATORY_CARE_PROVIDER_SITE_OTHER): Payer: 59 | Admitting: Family Medicine

## 2015-04-14 ENCOUNTER — Encounter: Payer: Self-pay | Admitting: Family Medicine

## 2015-04-14 VITALS — BP 136/68 | HR 101 | Temp 98.4°F | Resp 18 | Ht 62.0 in | Wt 208.2 lb

## 2015-04-14 DIAGNOSIS — E669 Obesity, unspecified: Secondary | ICD-10-CM

## 2015-04-14 DIAGNOSIS — B001 Herpesviral vesicular dermatitis: Secondary | ICD-10-CM

## 2015-04-14 MED ORDER — NALTREXONE-BUPROPION HCL ER 8-90 MG PO TB12: 2.0000 | ORAL_TABLET | Freq: Two times a day (BID) | ORAL | Status: DC

## 2015-04-14 MED ORDER — VALACYCLOVIR HCL 1 G PO TABS: 1000.0000 mg | ORAL_TABLET | Freq: Two times a day (BID) | ORAL | Status: DC

## 2015-04-14 NOTE — Progress Notes (Signed)
Name: Veronica Morton   MRN: [id number]    DOB: [birth year]   Date:04/14/2015       Progress Note  Subjective  Chief Complaint  Chief Complaint  Patient presents with  . Follow-up    6 week   . Obesity    Started on Contrave and doing well has losted 4 pounds since last visit. When patient took 4 pills she was having wild dreams but when she backed down to 3 daily-her symptoms went away. Patient states she has less appetite with medication but also feels fatigue.  . Fever Blister    Needs refill of medication, last outbreak was in January 2017    HPI  Obesity: she started on Contrave on 03/19/2015 - took two weeks for the medication to get approved. She has only been taking 3 pills daily, because taking 2 at night causes nightmares. She states medication has curbed her appetite. She has to make herself eat sometimes. She is also logging her calories intake. She is between 1200 - 1693. She has lost 4 lbs since last visit.   Fever Blister: she had one outbreak January 2017. She needs a refill of Valtrex.    Patient Active Problem List   Diagnosis Date Noted  . Candidal intertrigo 03/03/2015  . History of skin cancer of unknown type 01/22/2015  . Dyslipidemia 08/29/2014  . H/O knee surgery 08/29/2014  . Perimenopause 08/29/2014  . History of anemia 08/29/2014  . Dysmetabolic syndrome [id number]  . Decreased ferritin 08/29/2014  . Obesity (BMI 30-39.9) 08/29/2014  . 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 History of pneumonia 12/28/2012    Past Surgical History  Procedure Laterality Date  . Tubal ligation    . Appendectomy    . Total knee arthroplasty  08/04/2011    Procedure: TOTAL KNEE ARTHROPLASTY;  Surgeon: Ninetta Lights, MD;  Location: [place];  Service: Orthopedics;  Laterality: Right;  . Joint replacement    . Total knee arthroplasty Left 03/07/2013    Procedure: LEFT TOTAL KNEE ARTHROPLASTY;  Surgeon: Ninetta Lights, MD;  Location: [place];  Service:  Orthopedics;  Laterality: Left;  . Head & neck skin lesion excisional biopsy  01/22/2015    Dr. Grace Bushy at [facility]    Family History  Problem Relation Age of Onset  . Rectal cancer Father   . Cancer Father     Rectal  . Diabetes Father   . Colon cancer Paternal Grandmother   . Diabetes Mother   . Breast cancer Mother 56  . Diabetes Sister   . Hypertension Brother     Social History   Social History  . Marital Status: Single    Spouse Name: N/A  . Number of Children: N/A  . Years of Education: N/A   Occupational History  . Not on file.   Social History Main Topics  . Smoking status: Never Smoker   . Smokeless tobacco: Never Used  . Alcohol Use: 0.0 oz/week    0 Standard drinks or equivalent per week     Comment: occassionally-social  . Drug Use: No  . Sexual Activity: Not Currently   Other Topics Concern  . Not on file   Social History Narrative     Current outpatient prescriptions:  .  Naltrexone-Bupropion HCl ER (CONTRAVE) 8-90 MG TB12, Take 2 tablets by mouth 2 (two) times daily., Disp: 120 tablet, Rfl: 0 .  valACYclovir (VALTREX) 1000 MG tablet, Take 1,000 mg by mouth 2 (two)  times daily. Takes 1 pill bid prn fever blisters, Disp: , Rfl:   No Known Allergies   ROS  Ten systems reviewed and is negative except as mentioned in HPI   Objective  Filed Vitals:   04/14/15 0919  BP: 136/68  Pulse: 101  Temp: 98.4 F (36.9 C)  TempSrc: Oral  Resp: 18  Height: 5\' 2"  (1.575 m)  Weight: 208 lb 3.2 oz (94.439 kg)  SpO2: 97%    Body mass index is 38.07 kg/(m^2).  Physical Exam  Constitutional: Patient appears well-developed and well-nourished. Obese  No distress.  HEENT: head atraumatic, normocephalic, pupils equal and reactive to light, neck supple, throat within normal limits Cardiovascular: Normal rate, regular rhythm and normal heart sounds.  No murmur heard. No BLE edema. Pulmonary/Chest: Effort normal and breath sounds normal. No  respiratory distress. Abdominal: Soft.  There is no tenderness. Psychiatric: Patient has a normal mood and affect. behavior is normal. Judgment and thought content normal.  PHQ2/9: Depression screen [facility] 2/9 03/03/2015 08/29/2014  Decreased Interest 0 0  Down, Depressed, Hopeless 0 0  PHQ - 2 Score 0 0     Fall Risk: Fall Risk  03/03/2015 08/29/2014  Falls in the past year? No No    Assessment & Plan   1. Obesity (BMI 30-39.9)  Doing great, advised to try taking 2 in am , one at lunch and one at night, and see how she does - Naltrexone-Bupropion HCl ER (CONTRAVE) 8-90 MG TB12; Take 2 tablets by mouth 2 (two) times daily.  Dispense: 120 tablet; Refill: 2  2. Fever blister  - valACYclovir (VALTREX) 1000 MG tablet; Take 1 tablet (1,000 mg total) by mouth 2 (two) times daily. Takes 1 pill bid prn fever blisters  Dispense: 30 tablet; Refill: 0

## 2015-07-11 ENCOUNTER — Ambulatory Visit: Payer: 59 | Admitting: Family Medicine

## 2015-07-21 ENCOUNTER — Other Ambulatory Visit: Payer: Self-pay | Admitting: Family Medicine

## 2015-07-21 NOTE — Telephone Encounter (Signed)
Patient requesting refill. 

## 2015-08-04 ENCOUNTER — Telehealth: Payer: Self-pay | Admitting: Family Medicine

## 2015-08-04 NOTE — Telephone Encounter (Signed)
Pt states she needs referral for mammogram.

## 2015-08-05 ENCOUNTER — Telehealth: Payer: Self-pay | Admitting: Family Medicine

## 2015-08-05 DIAGNOSIS — R928 Other abnormal and inconclusive findings on diagnostic imaging of breast: Secondary | ICD-10-CM | POA: Insufficient documentation

## 2015-08-05 NOTE — Telephone Encounter (Signed)
Ordered, please schedule and contact patient with appointment

## 2015-08-05 NOTE — Telephone Encounter (Signed)
Pt said that you all need to schedule her a follow up mammo at [place]. She said that they asked her to follow up in 6 mnths and that will be in  Aug. Wants her to let her know when this is done.

## 2015-08-05 NOTE — Telephone Encounter (Signed)
Patient has been scheduled for 09/15/15 @ 9:20am.  Patient was informed.

## 2015-08-11 ENCOUNTER — Encounter: Payer: Self-pay | Admitting: Family Medicine

## 2015-08-11 ENCOUNTER — Ambulatory Visit (INDEPENDENT_AMBULATORY_CARE_PROVIDER_SITE_OTHER): Payer: 59 | Admitting: Family Medicine

## 2015-08-11 VITALS — BP 138/76 | HR 97 | Temp 98.2°F | Resp 18 | Ht 62.0 in | Wt 206.1 lb

## 2015-08-11 DIAGNOSIS — Z008 Encounter for other general examination: Secondary | ICD-10-CM

## 2015-08-11 DIAGNOSIS — E785 Hyperlipidemia, unspecified: Secondary | ICD-10-CM | POA: Diagnosis not present

## 2015-08-11 DIAGNOSIS — R928 Other abnormal and inconclusive findings on diagnostic imaging of breast: Secondary | ICD-10-CM

## 2015-08-11 DIAGNOSIS — E669 Obesity, unspecified: Secondary | ICD-10-CM | POA: Diagnosis not present

## 2015-08-11 DIAGNOSIS — Z0189 Encounter for other specified special examinations: Secondary | ICD-10-CM | POA: Diagnosis not present

## 2015-08-11 MED ORDER — NALTREXONE-BUPROPION HCL ER 8-90 MG PO TB12: 2.0000 | ORAL_TABLET | Freq: Two times a day (BID) | ORAL | 2 refills | Status: DC

## 2015-08-11 NOTE — Progress Notes (Signed)
Name: Veronica Morton   MRN: [id number]    DOB: July 28, 1960   Date:08/11/2015       Progress Note  Subjective  Chief Complaint  Chief Complaint  Patient presents with  . Obesity    pt here for 3 month follow up also [facility] form to fill out    HPI  Obesity: she started on Contrave on 03/19/2015  at 212 lbs  She states medication has curbed her appetite. She has to make herself eat sometimes. She is also logging her calories intake. She is between 1200 - 1600. She has lost 6 lbs since last visit. She states at home she went down to 200.4 lbs, she states since July 4th there was a lot of stress and changes ( mother was hospitalized, cousin died and her daughter had a MVA ). She has not been going to the gym, but is still eating health. She denies side effects of medication.   Dyslipidemia :she is on diet only, she is due for labs  History of anemia: not taking any supplementation, resolved after menopause,  April 2016  Abnormal mammogram: she was seen by surgeon and is due for repeat mammogram in August and is already scheduled. No pain , no nipple discharge or masses.   Patient Active Problem List   Diagnosis Date Noted  . Abnormal mammogram of left breast 08/05/2015  . Candidal intertrigo 03/03/2015  . History of skin cancer of unknown type 01/22/2015  . Dyslipidemia 08/29/2014  . H/O knee surgery 08/29/2014  . Perimenopause 08/29/2014  . History of anemia 08/29/2014  . Dysmetabolic syndrome Q000111Q  . Decreased ferritin 08/29/2014  . Obesity (BMI 30-39.9) 08/29/2014  . 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 History of pneumonia 12/28/2012    Past Surgical History:  Procedure Laterality Date  . APPENDECTOMY    . HEAD & NECK SKIN LESION EXCISIONAL BIOPSY  01/22/2015   Dr. Grace Bushy at [facility]  . JOINT REPLACEMENT    . TOTAL KNEE ARTHROPLASTY  08/04/2011   Procedure: TOTAL KNEE ARTHROPLASTY;  Surgeon: Ninetta Lights, MD;  Location: [place];   Service: Orthopedics;  Laterality: Right;  . TOTAL KNEE ARTHROPLASTY Left 03/07/2013   Procedure: LEFT TOTAL KNEE ARTHROPLASTY;  Surgeon: Ninetta Lights, MD;  Location: [place];  Service: Orthopedics;  Laterality: Left;  . TUBAL LIGATION      Family History  Problem Relation Age of Onset  . Rectal cancer Father   . Cancer Father     Rectal  . Diabetes Father   . Diabetes Mother   . Breast cancer Mother 52  . Diabetes Sister   . Hypertension Brother   . Colon cancer Paternal Grandmother     Social History   Social History  . Marital status: Single    Spouse name: N/A  . Number of children: N/A  . Years of education: N/A   Occupational History  . Not on file.   Social History Main Topics  . Smoking status: Never Smoker  . Smokeless tobacco: Never Used  . Alcohol use 0.0 oz/week     Comment: occassionally-social  . Drug use: No  . Sexual activity: Not Currently   Other Topics Concern  . Not on file   Social History Narrative  . No narrative on file     Current Outpatient Prescriptions:  .  Naltrexone-Bupropion HCl ER (CONTRAVE) 8-90 MG TB12, Take 2 tablets by mouth 2 (two) times daily., Disp: 120 tablet, Rfl: 2 .  valACYclovir (VALTREX) 1000 MG tablet, Take 1 tablet (1,000 mg total) by mouth 2 (two) times daily. Takes 1 pill bid prn fever blisters, Disp: 30 tablet, Rfl: 0  No Known Allergies   ROS  Constitutional: Negative for fever , positive for mild  weight change.  Respiratory: Negative for cough and shortness of breath.   Cardiovascular: Negative for chest pain or palpitations.  Gastrointestinal: Negative for abdominal pain, no bowel changes.  Musculoskeletal: Negative for gait problem or joint swelling.  Skin: Negative for rash.  Neurological: Negative for dizziness or headache.  No other specific complaints in a complete review of systems (except as listed in HPI above).  Objective  Vitals:   08/11/15 1015  BP: 138/76  Pulse: 97  Resp: 18   Temp: 98.2 F (36.8 C)  SpO2: 98%  Weight: 206 lb 1 oz (93.5 kg)  Height: 5\' 2"  (1.575 m)    Body mass index is 37.69 kg/m.  Physical Exam  Constitutional: Patient appears well-developed and well-nourished. Obese No distress.  HEENT: head atraumatic, normocephalic, pupils equal and reactive to light,  neck supple, throat within normal limits Cardiovascular: Normal rate, regular rhythm and normal heart sounds.  No murmur heard. No BLE edema. Pulmonary/Chest: Effort normal and breath sounds normal. No respiratory distress. Abdominal: Soft.  There is no tenderness. Psychiatric: Patient has a normal mood and affect. behavior is normal. Judgment and thought content normal.  PHQ2/9: Depression screen [facility] 2/9 08/11/2015 03/03/2015 08/29/2014  Decreased Interest 0 0 0  Down, Depressed, Hopeless 0 0 0  PHQ - 2 Score 0 0 0  Altered sleeping 0 - -  Tired, decreased energy 0 - -  Change in appetite 0 - -  Feeling bad or failure about yourself  0 - -  Trouble concentrating 0 - -  Moving slowly or fidgety/restless 0 - -  Suicidal thoughts 0 - -  PHQ-9 Score 0 - -     Fall Risk: Fall Risk  03/03/2015 08/29/2014  Falls in the past year? No No      Functional Status Survey: Is the patient deaf or have difficulty hearing?: No Does the patient have difficulty seeing, even when wearing glasses/contacts?: No Does the patient have difficulty concentrating, remembering, or making decisions?: No Does the patient have difficulty walking or climbing stairs?: No Does the patient have difficulty dressing or bathing?: No Does the patient have difficulty doing errands alone such as visiting a doctor's office or shopping?: No   Assessment & Plan  1. Dyslipidemia  Recheck labs  2. Obesity (BMI 30-39.9)  - Naltrexone-Bupropion HCl ER (CONTRAVE) 8-90 MG TB12; Take 2 tablets by mouth 2 (two) times daily.  Dispense: 120 tablet; Refill: 2 I will order a raised desk for work  She has not lost  enough, but is going to resume exercise  3. Encounter for biometric screening  - Nicotine/cotinine metabolites - Hemoglobin A1c - Lipid panel - CBC with Differential/Platelet - Comprehensive metabolic panel   4. Abnormal mammogram of left breast  Keep appointment with mammogram

## 2015-08-18 LAB — CBC WITH DIFFERENTIAL/PLATELET
Basophils Absolute: 0.1 10*3/uL (ref 0.0–0.2)
Basos: 1 %
EOS (ABSOLUTE): 0.3 10*3/uL (ref 0.0–0.4)
EOS: 4 %
Hematocrit: 42.6 % (ref 34.0–46.6)
Hemoglobin: 14.2 g/dL (ref 11.1–15.9)
IMMATURE GRANULOCYTES: 0 %
Immature Grans (Abs): 0 10*3/uL (ref 0.0–0.1)
LYMPHS: 23 %
Lymphocytes Absolute: 1.8 10*3/uL (ref 0.7–3.1)
MCH: 28.2 pg (ref 26.6–33.0)
MCHC: 33.3 g/dL (ref 31.5–35.7)
MCV: 85 fL (ref 79–97)
Monocytes Absolute: 0.5 10*3/uL (ref 0.1–0.9)
Monocytes: 7 %
NEUTROS PCT: 65 %
Neutrophils Absolute: 5.1 10*3/uL (ref 1.4–7.0)
PLATELETS: 203 10*3/uL (ref 150–379)
RBC: 5.03 x10E6/uL (ref 3.77–5.28)
RDW: 14.1 % (ref 12.3–15.4)
WBC: 7.8 10*3/uL (ref 3.4–10.8)

## 2015-08-18 LAB — COMPREHENSIVE METABOLIC PANEL
A/G RATIO: 1.8 (ref 1.2–2.2)
ALBUMIN: 4.2 g/dL (ref 3.5–5.5)
ALT: 19 IU/L (ref 0–32)
AST: 18 IU/L (ref 0–40)
Alkaline Phosphatase: 77 IU/L (ref 39–117)
BILIRUBIN TOTAL: 0.8 mg/dL (ref 0.0–1.2)
BUN / CREAT RATIO: 9 (ref 9–23)
BUN: 8 mg/dL (ref 6–24)
CALCIUM: 9.6 mg/dL (ref 8.7–10.2)
CO2: 25 mmol/L (ref 18–29)
Chloride: 104 mmol/L (ref 96–106)
Creatinine, Ser: 0.86 mg/dL (ref 0.57–1.00)
GFR, EST AFRICAN AMERICAN: 90 mL/min/{1.73_m2} (ref >59)
GFR, EST NON AFRICAN AMERICAN: 78 mL/min/{1.73_m2} (ref >59)
GLUCOSE: 95 mg/dL (ref 65–99)
Globulin, Total: 2.3 g/dL (ref 1.5–4.5)
Potassium: 4.8 mmol/L (ref 3.5–5.2)
Sodium: 142 mmol/L (ref 134–144)
TOTAL PROTEIN: 6.5 g/dL (ref 6.0–8.5)

## 2015-08-18 LAB — HEMOGLOBIN A1C
ESTIMATED AVERAGE GLUCOSE: 105 mg/dL
Hgb A1c MFr Bld: 5.3 % (ref 4.8–5.6)

## 2015-08-18 LAB — LIPID PANEL
CHOLESTEROL TOTAL: 204 mg/dL — AB (ref 100–199)
Chol/HDL Ratio: 4.4 ratio units (ref 0.0–4.4)
HDL: 46 mg/dL (ref >39)
LDL CALC: 136 mg/dL — AB (ref 0–99)
Triglycerides: 109 mg/dL (ref 0–149)
VLDL Cholesterol Cal: 22 mg/dL (ref 5–40)

## 2015-08-18 LAB — NICOTINE/COTININE METABOLITES
COTININE: NOT DETECTED ng/mL
NICOTINE: NOT DETECTED ng/mL

## 2015-09-15 ENCOUNTER — Ambulatory Visit
Admission: RE | Admit: 2015-09-15 | Discharge: 2015-09-15 | Disposition: A | Discharge status: Home / Self Care | Payer: 59 | Source: Ambulatory Visit | Attending: Family Medicine | Admitting: Family Medicine

## 2015-09-15 DIAGNOSIS — R928 Other abnormal and inconclusive findings on diagnostic imaging of breast: Secondary | ICD-10-CM | POA: Diagnosis present

## 2015-09-15 DIAGNOSIS — N63 Unspecified lump in breast: Secondary | ICD-10-CM | POA: Insufficient documentation

## 2015-09-16 ENCOUNTER — Other Ambulatory Visit: Payer: Self-pay | Admitting: Family Medicine

## 2015-09-16 DIAGNOSIS — R928 Other abnormal and inconclusive findings on diagnostic imaging of breast: Secondary | ICD-10-CM

## 2015-11-12 ENCOUNTER — Ambulatory Visit: Payer: 59 | Admitting: Family Medicine

## 2016-01-19 HISTORY — PX: BREAST BIOPSY: SHX20

## 2016-02-04 ENCOUNTER — Ambulatory Visit: Payer: 59 | Admitting: Family Medicine

## 2016-02-12 ENCOUNTER — Ambulatory Visit (INDEPENDENT_AMBULATORY_CARE_PROVIDER_SITE_OTHER): Payer: 59 | Admitting: Family Medicine

## 2016-02-12 ENCOUNTER — Encounter: Payer: Self-pay | Admitting: Family Medicine

## 2016-02-12 VITALS — BP 146/78 | HR 91 | Temp 97.9°F | Resp 16 | Ht 62.0 in | Wt 189.4 lb

## 2016-02-12 DIAGNOSIS — E669 Obesity, unspecified: Secondary | ICD-10-CM

## 2016-02-12 DIAGNOSIS — Z1231 Encounter for screening mammogram for malignant neoplasm of breast: Secondary | ICD-10-CM | POA: Diagnosis not present

## 2016-02-12 DIAGNOSIS — H40039 Anatomical narrow angle, unspecified eye: Secondary | ICD-10-CM | POA: Diagnosis not present

## 2016-02-12 DIAGNOSIS — E785 Hyperlipidemia, unspecified: Secondary | ICD-10-CM

## 2016-02-12 DIAGNOSIS — R030 Elevated blood-pressure reading, without diagnosis of hypertension: Secondary | ICD-10-CM | POA: Diagnosis not present

## 2016-02-12 DIAGNOSIS — Z1239 Encounter for other screening for malignant neoplasm of breast: Secondary | ICD-10-CM

## 2016-02-12 NOTE — Progress Notes (Signed)
Name: Veronica Morton   MRN: [id number]    DOB: 1960-12-03   Date:02/12/2016       Progress Note  Subjective  Chief Complaint  Chief Complaint  Patient presents with  . Obesity    3 month folow up  . Hyperlipidemia    HPI  Obesity: she started on Contrave on 03/19/2015  at 212 lbs, she stopped taking it October 2017 because she did not noticed any progression of weight loss. She started a program called [employer] that has coaching and pre-package meals.  She is eating about 1000 calories. She has lost 17 lbs since last visit. She is taking vitamin supplements and is back at the gym.   Dyslipidemia :she is on diet only, we will recheck labs during CPE  Abnormal mammogram: she was seen by surgeon and is due for repeat mammogram in August and is already scheduled. No pain , no nipple discharge or masses. She is due for repeat pap smear  Narrow angle both eyes: she will go back for laser surgery, no glaucoma at this time  Patient Active Problem List   Diagnosis Date Noted  . Abnormal mammogram of left breast 08/05/2015  . Candidal intertrigo 03/03/2015  . History of skin cancer of unknown type 01/22/2015  . Dyslipidemia 08/29/2014  . H/O knee surgery 08/29/2014  . Menopause 08/29/2014  . History of anemia 08/29/2014  . Dysmetabolic syndrome Q000111Q  . Decreased ferritin 08/29/2014  . Obesity (BMI 30-39.9) 08/29/2014  . 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 History of pneumonia 12/28/2012    Past Surgical History:  Procedure Laterality Date  . APPENDECTOMY    . HEAD & NECK SKIN LESION EXCISIONAL BIOPSY  01/22/2015   Dr. Grace Bushy at [facility]  . JOINT REPLACEMENT    . TOTAL KNEE ARTHROPLASTY  08/04/2011   Procedure: TOTAL KNEE ARTHROPLASTY;  Surgeon: Ninetta Lights, MD;  Location: [place];  Service: Orthopedics;  Laterality: Right;  . TOTAL KNEE ARTHROPLASTY Left 03/07/2013   Procedure: LEFT TOTAL KNEE ARTHROPLASTY;  Surgeon: Ninetta Lights, MD;  Location: [place];  Service: Orthopedics;  Laterality: Left;  . TUBAL LIGATION      Family History  Problem Relation Age of Onset  . Rectal cancer Father   . Cancer Father     Rectal  . Diabetes Father   . Diabetes Mother   . Breast cancer Mother 42  . Diabetes Sister   . Hypertension Brother   . Colon cancer Paternal Grandmother     Social History   Social History  . Marital status: Single    Spouse name: N/A  . Number of children: N/A  . Years of education: N/A   Occupational History  . Not on file.   Social History Main Topics  . Smoking status: Never Smoker  . Smokeless tobacco: Never Used  . Alcohol use 0.0 oz/week     Comment: occassionally-social  . Drug use: No  . Sexual activity: Not Currently   Other Topics Concern  . Not on file   Social History Narrative  . No narrative on file     Current Outpatient Prescriptions:  .  Biotin 10000 MCG TABS, Take by mouth., Disp: , Rfl:  .  calcium-vitamin D 250-100 MG-UNIT tablet, Take 1 tablet by mouth 2 (two) times daily., Disp: , Rfl:  .  valACYclovir (VALTREX) 1000 MG tablet, Take 1 tablet (1,000 mg total) by mouth 2 (two) times daily. Takes  1 pill bid prn fever blisters, Disp: 30 tablet, Rfl: 0 .  vitamin B-12 (CYANOCOBALAMIN) 1000 MCG tablet, Take 1,000 mcg by mouth daily., Disp: , Rfl:   No Known Allergies   ROS  Constitutional: Negative for fever , positive for weight change.  Respiratory: Negative for cough and shortness of breath.   Cardiovascular: Negative for chest pain or palpitations.  Gastrointestinal: Negative for abdominal pain, no bowel changes.  Musculoskeletal: Negative for gait problem or joint swelling.  Skin: Negative for rash.  Neurological: Negative for dizziness or headache.  No other specific complaints in a complete review of systems (except as listed in HPI above).  Objective  Vitals:   02/12/16 1048  BP: (!) 146/78  Pulse: 91  Resp: 16  Temp: 97.9 F (36.6 C)   TempSrc: Oral  SpO2: 97%  Weight: 189 lb 7 oz (85.9 kg)  Height: 5\' 2"  (1.575 m)    Body mass index is 34.65 kg/m.  Physical Exam  Constitutional: Patient appears well-developed and well-nourished. Obese  No distress.  HEENT: head atraumatic, normocephalic, pupils equal and reactive to light, neck supple, throat within normal limits Cardiovascular: Normal rate, regular rhythm and normal heart sounds.  No murmur heard. No BLE edema. Pulmonary/Chest: Effort normal and breath sounds normal. No respiratory distress. Abdominal: Soft.  There is no tenderness. Psychiatric: Patient has a normal mood and affect. behavior is normal. Judgment and thought content normal.  PHQ2/9: Depression screen [facility] 2/9 02/12/2016 08/11/2015 03/03/2015 08/29/2014  Decreased Interest 0 0 0 0  Down, Depressed, Hopeless 0 0 0 0  PHQ - 2 Score 0 0 0 0  Altered sleeping - 0 - -  Tired, decreased energy - 0 - -  Change in appetite - 0 - -  Feeling bad or failure about yourself  - 0 - -  Trouble concentrating - 0 - -  Moving slowly or fidgety/restless - 0 - -  Suicidal thoughts - 0 - -  PHQ-9 Score - 0 - -     Fall Risk: Fall Risk  02/12/2016 03/03/2015 08/29/2014  Falls in the past year? No No No    Functional Status Survey: Is the patient deaf or have difficulty hearing?: No Does the patient have difficulty seeing, even when wearing glasses/contacts?: No Does the patient have difficulty concentrating, remembering, or making decisions?: No Does the patient have difficulty walking or climbing stairs?: No Does the patient have difficulty dressing or bathing?: No Does the patient have difficulty doing errands alone such as visiting a doctor's office or shopping?: No    Assessment & Plan  1. Dyslipidemia  Recheck next visit   2. Breast cancer screening  - MM Digital Screening; Future  3. Elevated blood pressure reading  Monitor at home   4. Anatomical narrow angle  We will request records from  eye doctor  5. Obesity (BMI 30-39.9)  - vitamin B-12 (CYANOCOBALAMIN) 1000 MCG tablet; Take 1,000 mcg by mouth daily. - calcium-vitamin D 250-100 MG-UNIT tablet; Take 1 tablet by mouth 2 (two) times daily. - Biotin 10000 MCG TABS; Take by mouth.

## 2016-02-13 ENCOUNTER — Encounter: Payer: Self-pay | Admitting: Family Medicine

## 2016-02-16 ENCOUNTER — Other Ambulatory Visit: Payer: Self-pay | Admitting: Family Medicine

## 2016-02-16 ENCOUNTER — Telehealth: Payer: Self-pay | Admitting: Family Medicine

## 2016-02-16 DIAGNOSIS — R928 Other abnormal and inconclusive findings on diagnostic imaging of breast: Secondary | ICD-10-CM

## 2016-02-16 NOTE — Telephone Encounter (Signed)
Corrected orders have been placed

## 2016-02-16 NOTE — Telephone Encounter (Signed)
Dr. Ancil Boozer placed the order on 02/12/16. Patient can call [place] at [phone] at her convenience.  Thanks

## 2016-02-16 NOTE — Telephone Encounter (Signed)
Pt needs order put in so she can schedule her mammogram.

## 2016-02-16 NOTE — Telephone Encounter (Signed)
Pt states she has called them and the doctor needs to put in a order for a regular mammogram.

## 2016-02-17 NOTE — Telephone Encounter (Signed)
LMOM to inform pt °

## 2016-02-19 HISTORY — PX: GLAUCOMA SURGERY: SHX656

## 2016-02-24 ENCOUNTER — Encounter: Payer: Self-pay | Admitting: Family Medicine

## 2016-03-10 ENCOUNTER — Ambulatory Visit
Admission: RE | Admit: 2016-03-10 | Discharge: 2016-03-10 | Disposition: A | Discharge status: Home / Self Care | Payer: 59 | Source: Ambulatory Visit | Attending: Family Medicine | Admitting: Family Medicine

## 2016-03-10 ENCOUNTER — Other Ambulatory Visit: Payer: Self-pay | Admitting: Family Medicine

## 2016-03-10 ENCOUNTER — Telehealth: Payer: Self-pay | Admitting: Family Medicine

## 2016-03-10 ENCOUNTER — Encounter: Payer: Self-pay | Admitting: *Deleted

## 2016-03-10 DIAGNOSIS — N6320 Unspecified lump in the left breast, unspecified quadrant: Secondary | ICD-10-CM | POA: Insufficient documentation

## 2016-03-10 DIAGNOSIS — R928 Other abnormal and inconclusive findings on diagnostic imaging of breast: Secondary | ICD-10-CM | POA: Diagnosis present

## 2016-03-10 NOTE — Telephone Encounter (Signed)
Pt was referred to see Dr Marlyn Corporal and is asking that you please return her call to discuss this.

## 2016-03-11 NOTE — Telephone Encounter (Signed)
I spoke with this patient and she stated that she wanted to go to [facility] with Dr. Johnathan Hausen.  New referral was sent to their office.  [id number]  Confirmation was received

## 2016-03-17 ENCOUNTER — Encounter: Payer: Self-pay | Admitting: General Surgery

## 2016-03-17 ENCOUNTER — Ambulatory Visit (INDEPENDENT_AMBULATORY_CARE_PROVIDER_SITE_OTHER): Payer: 59 | Admitting: General Surgery

## 2016-03-17 ENCOUNTER — Inpatient Hospital Stay: Payer: Self-pay

## 2016-03-17 ENCOUNTER — Ambulatory Visit: Payer: Self-pay | Admitting: General Surgery

## 2016-03-17 VITALS — BP 146/78 | HR 74 | Resp 12 | Ht 62.0 in | Wt 195.0 lb

## 2016-03-17 DIAGNOSIS — N6320 Unspecified lump in the left breast, unspecified quadrant: Secondary | ICD-10-CM | POA: Diagnosis not present

## 2016-03-17 NOTE — Progress Notes (Signed)
Patient ID: Veronica Morton, female   DOB: 1960/05/13, 56 y.o.   MRN: [id number]  Chief Complaint  Patient presents with  . Other     mammogram    HPI Veronica Morton is a 56 y.o. female who presents for a breast evaluation. The most recent mammogram and left breast ultrasound was done on 03/10/2016  . She can not feel anything different in the breast. She has had to have multiple mammogram in the past. Denies any breast injury or trauma. She did see a Psychologist, sport and exercise last year in [place] Dr Hassell Done but never needed surgery. Patient does perform regular self breast checks and gets regular mammograms done.    HPI  Past Medical History:  Diagnosis Date  . Allergy   . Anemia   . Arthritis   . Basal cell carcinoma of left lower eyelid 12/20/2014   [facility]: Dr. Otila Back  . Cancer ([place]) 2017   skin  . Dyslipidemia   . Glaucoma   . Herpes simplex disease   . Hypertension   . Low ferritin   . Murmur   . Obesity   . Peri-menopause   . Tremor     Past Surgical History:  Procedure Laterality Date  . APPENDECTOMY    . GLAUCOMA SURGERY Right 02/2016  . HEAD & NECK SKIN LESION EXCISIONAL BIOPSY  01/22/2015   Dr. Grace Bushy at [facility]  . JOINT REPLACEMENT    . MASS EXCISION Left    ankle/ Dr Bary Castilla  . TOTAL KNEE ARTHROPLASTY  08/04/2011   Procedure: TOTAL KNEE ARTHROPLASTY;  Surgeon: Ninetta Lights, MD;  Location: [place];  Service: Orthopedics;  Laterality: Right;  . TOTAL KNEE ARTHROPLASTY Left 03/07/2013   Procedure: LEFT TOTAL KNEE ARTHROPLASTY;  Surgeon: Ninetta Lights, MD;  Location: [place];  Service: Orthopedics;  Laterality: Left;  . TUBAL LIGATION      Family History  Problem Relation Age of Onset  . Rectal cancer Father 108  . Diabetes Father   . Diabetes Mother   . Breast cancer Mother 77  . Diabetes Sister   . Hypertension Brother   . Colon cancer Paternal Grandmother     Social History Social History  Substance Use  Topics  . Smoking status: Never Smoker  . Smokeless tobacco: Never Used  . Alcohol use 0.0 oz/week     Comment: occassionally-social    No Known Allergies  Current Outpatient Prescriptions  Medication Sig Dispense Refill  . Biotin 10000 MCG TABS Take by mouth.    . calcium-vitamin D 250-100 MG-UNIT tablet Take 1 tablet by mouth 2 (two) times daily.    . Carboxymethylcellul-Glycerin (REFRESH OPTIVE OP) Apply to eye.    . cetirizine (ZYRTEC) 10 MG tablet Take 10 mg by mouth daily.    . dorzolamide (TRUSOPT) 2 % ophthalmic solution 1 drop 3 (three) times daily.    . Doxylamine Succinate, Sleep, (SLEEP AID PO) Take by mouth.    . prednisoLONE acetate (PRED FORTE) 1 % ophthalmic suspension 1 drop 4 (four) times daily.    . valACYclovir (VALTREX) 1000 MG tablet Take 1 tablet (1,000 mg total) by mouth 2 (two) times daily. Takes 1 pill bid prn fever blisters 30 tablet 0  . vitamin B-12 (CYANOCOBALAMIN) 1000 MCG tablet Take 1,000 mcg by mouth daily.     No current facility-administered medications for this visit.     Review of Systems Review of Systems  Constitutional: Negative.   Respiratory:  Negative.   Cardiovascular: Negative.     Blood pressure (!) 146/78, pulse 74, resp. rate 12, height 5\' 2"  (1.575 m), weight 195 lb (88.5 kg), last menstrual period 05/12/2014.  Physical Exam Physical Exam  Constitutional: She is oriented to person, place, and time. She appears well-developed and well-nourished.  HENT:  Mouth/Throat: Oropharynx is clear and moist.  Eyes: Conjunctivae are normal. No scleral icterus.  Neck: Neck supple.  Cardiovascular: Normal rate, regular rhythm and normal heart sounds.   Pulmonary/Chest: Effort normal and breath sounds normal. Right breast exhibits no inverted nipple, no mass, no nipple discharge, no skin change and no tenderness. Left breast exhibits no inverted nipple, no mass, no nipple discharge, no skin change and no tenderness.    Right nipple crease  present.   Lymphadenopathy:    She has no cervical adenopathy.    She has no axillary adenopathy.  Neurological: She is alert and oriented to person, place, and time.  Skin: Skin is warm and dry.  Psychiatric: Her behavior is normal.    Data Reviewed Multiple mammogram and ultrasound imaging studies between November 2014 and late December 2018 reviewed.  Left breast ultrasound 03/14/2015 appeared showed 2 areas of complex cyst.  Follow-up ultrasound if 03/10/2016 showed the 5:00 area resolved, 4:00 area unchanged. Soft tissue density corresponding to the new mammographic abnormality identified at 3:00 position. BIRAD-4.  Ultrasound examination of the left breast at the 3:00 position was completed. Or centimeters from the nipple a hypoechoic mass with some flank acoustic shadowing measuring 0.7 x 0.75 x 0.76 cm was identified. This corresponded in appearance to the [facility] study.  The patient was amenable to biopsy.  10 mL of 0.5% Xylocaine with 0.25% Marcaine with 1-200,000 of epinephrine was utilized well tolerated. A 10-gauge Encor device was utilized and 7 core samples obtained with complete removal of the area. A small collection of blood developed and this was aspirated. Postbiopsy clip placed. Skin defect was closed with benzoin and Steri-Strips followed by Telfa and Tegaderm dressing. Ice pack applied.   Assessment    New left breast mass, stable complex cyst left lateral breast.    Plan      The patient will be contacted when biopsy results are available. Postbiopsy care reviewed.      This information has been scribed by Karie Fetch RN, BSN,BC.  Robert Bellow 03/18/2016, 2:32 PM

## 2016-03-17 NOTE — Patient Instructions (Addendum)

## 2016-03-18 DIAGNOSIS — N6320 Unspecified lump in the left breast, unspecified quadrant: Secondary | ICD-10-CM | POA: Insufficient documentation

## 2016-03-19 ENCOUNTER — Other Ambulatory Visit: Payer: Self-pay

## 2016-03-19 ENCOUNTER — Telehealth: Payer: Self-pay

## 2016-03-19 DIAGNOSIS — N6320 Unspecified lump in the left breast, unspecified quadrant: Secondary | ICD-10-CM

## 2016-03-19 LAB — PATHOLOGY

## 2016-03-19 NOTE — Telephone Encounter (Signed)
Notified patient as instructed, patient pleased. Discussed follow-up appointments, patient agrees. Patient will call to schedule mammogram once she is no longer tender.

## 2016-03-19 NOTE — Telephone Encounter (Signed)
-----   Message from Robert Bellow, MD sent at 03/19/2016 11:01 AM EST ----- Notify patient biopsy results were fine. Would like her to get a left diag mammogram to confirm the area of concern on mammogram matched the area on ultrasound (routine).  She can do this when she is comfortable post biopsy. Thanks.

## 2016-04-22 ENCOUNTER — Ambulatory Visit
Admission: RE | Admit: 2016-04-22 | Discharge: 2016-04-22 | Disposition: A | Discharge status: Home / Self Care | Payer: 59 | Source: Ambulatory Visit | Attending: General Surgery | Admitting: General Surgery

## 2016-04-22 DIAGNOSIS — N6320 Unspecified lump in the left breast, unspecified quadrant: Secondary | ICD-10-CM

## 2016-04-26 ENCOUNTER — Encounter: Payer: 59 | Admitting: Family Medicine

## 2016-08-09 ENCOUNTER — Telehealth: Payer: Self-pay

## 2016-08-09 NOTE — Telephone Encounter (Signed)
-----   Message from Robert Bellow, MD sent at 08/08/2016  9:25 AM EDT ----- Please notify the patient that her postbiopsy mammogram showed the area of concern has been removed. She should resume annual screening mammograms in February 2019 with her primary care provider ----- Message ----- From: Lesly Rubenstein, LPN Sent: 02/20/6331   2:48 PM To: Robert Bellow, MD  Mammogram completed today  ----- Message ----- From: Robert Bellow, MD Sent: 04/21/2016   9:14 AM To: Lesly Rubenstein, LPN  Contact patient if she has not had post procedure mammogram by the end of the month.  ----- Message ----- From: Interface, [facility] Lab Results In Sent: 03/19/2016   1:36 PM To: Robert Bellow, MD

## 2016-08-09 NOTE — Telephone Encounter (Signed)
Notified patient as instructed, patient pleased. Discussed follow-up appointments, patient agrees  

## 2016-08-11 ENCOUNTER — Ambulatory Visit: Payer: 59 | Admitting: Family Medicine

## 2016-09-07 LAB — LIPID PANEL
Cholesterol: 185 (ref 0–200)
HDL: 45 (ref 35–70)
LDL Cholesterol: 119
TRIGLYCERIDES: 106 (ref 40–160)

## 2016-09-07 LAB — HEMOGLOBIN A1C: HEMOGLOBIN A1C: 5.2

## 2016-09-07 LAB — BASIC METABOLIC PANEL
Creatinine: 0.7 (ref 0.5–1.1)
GLUCOSE: 93

## 2016-09-14 ENCOUNTER — Encounter: Payer: Self-pay | Admitting: Family Medicine

## 2016-09-14 ENCOUNTER — Ambulatory Visit (INDEPENDENT_AMBULATORY_CARE_PROVIDER_SITE_OTHER): Payer: 59 | Admitting: Family Medicine

## 2016-09-14 VITALS — BP 162/82 | Temp 98.2°F | Resp 16 | Ht 62.0 in | Wt 200.6 lb

## 2016-09-14 DIAGNOSIS — E785 Hyperlipidemia, unspecified: Secondary | ICD-10-CM

## 2016-09-14 DIAGNOSIS — F4321 Adjustment disorder with depressed mood: Secondary | ICD-10-CM

## 2016-09-14 DIAGNOSIS — I10 Essential (primary) hypertension: Secondary | ICD-10-CM | POA: Diagnosis not present

## 2016-09-14 DIAGNOSIS — E669 Obesity, unspecified: Secondary | ICD-10-CM

## 2016-09-14 DIAGNOSIS — R079 Chest pain, unspecified: Secondary | ICD-10-CM

## 2016-09-14 DIAGNOSIS — H4020X1 Unspecified primary angle-closure glaucoma, mild stage: Secondary | ICD-10-CM

## 2016-09-14 DIAGNOSIS — Z23 Encounter for immunization: Secondary | ICD-10-CM

## 2016-09-14 DIAGNOSIS — H4020X0 Unspecified primary angle-closure glaucoma, stage unspecified: Secondary | ICD-10-CM | POA: Insufficient documentation

## 2016-09-14 MED ORDER — ESCITALOPRAM OXALATE 5 MG PO TABS: 5.0000 mg | ORAL_TABLET | Freq: Every day | ORAL | 0 refills | Status: DC

## 2016-09-14 MED ORDER — HYDROCHLOROTHIAZIDE 12.5 MG PO TABS: 12.5000 mg | ORAL_TABLET | Freq: Every day | ORAL | 0 refills | Status: DC

## 2016-09-14 NOTE — Progress Notes (Signed)
Name: Veronica Morton   MRN: [id number]    DOB: 02-Jan-1961   Date:09/14/2016       Progress Note  Subjective  Chief Complaint  Chief Complaint  Patient presents with  . Hypertension    Has been elevated due to stress with husband having recent stroke and fathers cancer coming back.    HPI  Obesity: she started on Contrave on 03/19/2015 at 212 lbs, she stopped taking it October 2017 because she did not noticed any progression of weight loss. She started a program called [employer] with meal planning and life coach, she lost weight on it.  She is eating about 1000 calories. She has lost 17 lbs since last visit the previous 6 months, but gained 5 lbs over the past couple of months, she will contact her coach again. She is taking vitamin supplements and is back at the gym.   HTN: bp has been elevated at health screening at work, and also when tried to donate blood recently, bp today is slightly up, but went up to 160 during re-check, no SOB or  palpitation, discussed life style modifications, we will start HCTZ and if no improvement we will add ARB. Advised to take aspirin also  Chest pain: she states that she had one episode of chest pain, described as a dull ache on left side of chest, that lasted about 20 minutes, not associated with SOB, diaphoresis, no shoulder or neck pain associated with pain. She is under a lot of stress, mother was admitted for sepsis, father recently told cancer is back, and husband had a stroke July 2018, she is also under more stress at work  Dyslipidemia :she is on diet only, reviewed labs done at work and at goal.   Situational depression: she is under a lot of stress, worries about mother, father and husband, stress at work is high, she states focus was low when mother hospitalized. Discussed mindfulness and medication, she would like to try low dose Lexapro. She has been sleeping well, with otc supplements.   Narrow angle both eyes: she will go back for  laser surgery, doing better, still applying drops daily and has follow up with Dr. Herbert Deaner every 6 months   Patient Active Problem List   Diagnosis Date Noted  . Left breast mass 03/18/2016  . Abnormal mammogram of left breast 08/05/2015  . History of skin cancer of unknown type 01/22/2015  . Dyslipidemia 08/29/2014  . H/O knee surgery 08/29/2014  . Menopause 08/29/2014  . History of anemia 08/29/2014  . Dysmetabolic syndrome 13/24/4010  . Decreased ferritin 08/29/2014  . Obesity (BMI 30-39.9) 08/29/2014  . 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 History of pneumonia 12/28/2012    Past Surgical History:  Procedure Laterality Date  . APPENDECTOMY    . BREAST BIOPSY Left 2018   COMPLETED IN [facility] - [facility]  . GLAUCOMA SURGERY Right 02/2016  . HEAD & NECK SKIN LESION EXCISIONAL BIOPSY  01/22/2015   Dr. Grace Bushy [facility]  . JOINT REPLACEMENT    . MASS EXCISION Left    ankle/ Dr Bary Castilla  . TOTAL KNEE ARTHROPLASTY  08/04/2011   Procedure: TOTAL KNEE ARTHROPLASTY;  Surgeon: Ninetta Lights, MD;  Location: [place];  Service: Orthopedics;  Laterality: Right;  . TOTAL KNEE ARTHROPLASTY Left 03/07/2013   Procedure: LEFT TOTAL KNEE ARTHROPLASTY;  Surgeon: Ninetta Lights, MD;  Location: [place];  Service: Orthopedics;  Laterality: Left;  . TUBAL  LIGATION      Family History  Problem Relation Age of Onset  . Rectal cancer Father 85  . Diabetes Father   . Diabetes Mother   . Breast cancer Mother 25  . Diabetes Sister   . Hypertension Brother   . Colon cancer Paternal Grandmother     Social History   Social History  . Marital status: Single    Spouse name: N/A  . Number of children: N/A  . Years of education: N/A   Occupational History  . Not on file.   Social History Main Topics  . Smoking status: Never Smoker  . Smokeless tobacco: Never Used  . Alcohol use 0.0 oz/week     Comment: occassionally-social  . Drug use: No  . Sexual  activity: Not Currently   Other Topics Concern  . Not on file   Social History Narrative   Married, husband is 30 years older, and had a hemorrhagic stroke on July 4th, 2018 and still has left side weakness   Parents are aging and having more medical problems.    Works Conservator, museum/gallery at [employer]     Current Outpatient Prescriptions:  .  Biotin 10000 MCG TABS, Take by mouth., Disp: , Rfl:  .  calcium-vitamin D 250-100 MG-UNIT tablet, Take 1 tablet by mouth 2 (two) times daily., Disp: , Rfl:  .  Carboxymethylcellul-Glycerin (REFRESH OPTIVE OP), Apply to eye., Disp: , Rfl:  .  cetirizine (ZYRTEC) 10 MG tablet, Take 10 mg by mouth as needed. , Disp: , Rfl:  .  dorzolamide (TRUSOPT) 2 % ophthalmic solution, 1 drop 3 (three) times daily., Disp: , Rfl:  .  Doxylamine Succinate, Sleep, (SLEEP AID PO), Take by mouth., Disp: , Rfl:  .  valACYclovir (VALTREX) 1000 MG tablet, Take 1 tablet (1,000 mg total) by mouth 2 (two) times daily. Takes 1 pill bid prn fever blisters, Disp: 30 tablet, Rfl: 0 .  vitamin B-12 (CYANOCOBALAMIN) 1000 MCG tablet, Take 1,000 mcg by mouth daily., Disp: , Rfl:  .  prednisoLONE acetate (PRED FORTE) 1 % ophthalmic suspension, 1 drop 4 (four) times daily., Disp: , Rfl:   No Known Allergies   ROS  Constitutional: Negative for fever, positive weight change.  Respiratory: Negative for cough and shortness of breath.   Cardiovascular: Negative for chest pain or palpitations.  Gastrointestinal: Negative for abdominal pain, no bowel changes.  Musculoskeletal: Negative for gait problem or joint swelling.  Skin: Negative for rash.  Neurological: Negative for dizziness or headache.  No other specific complaints in a complete review of systems (except as listed in HPI above).  Objective  Vitals:   09/14/16 1019 09/14/16 1042  BP:  (!) 162/82  Resp: 16   Temp: 98.2 F (36.8 C)   TempSrc: Oral   SpO2: 96%   Weight: 200 lb 9.6 oz (91 kg)   Height: 5\' 2"  (1.575 m)      Body mass index is 36.69 kg/m.  Physical Exam  Constitutional: Patient appears well-developed and well-nourished. Obese  No distress.  HEENT: head atraumatic, normocephalic, pupils equal and reactive to light,  neck supple, throat within normal limits Cardiovascular: Normal rate, regular rhythm and normal heart sounds.  No murmur heard. No BLE edema. Pulmonary/Chest: Effort normal and breath sounds normal. No respiratory distress. Abdominal: Soft.  There is no tenderness. Psychiatric: Patient has a normal mood and affect. behavior is normal. Judgment and thought content normal.  Recent Results (from the past 2160 hour(s))  Basic metabolic panel  Status: None   Collection Time: 09/07/16 12:00 AM  Result Value Ref Range   Glucose 93    Creatinine 0.7 0.5 - 1.1  Lipid panel     Status: None   Collection Time: 09/07/16 12:00 AM  Result Value Ref Range   Triglycerides 106 40 - 160   Cholesterol 185 0 - 200   HDL 45 35 - 70   LDL Cholesterol 119   Hemoglobin A1c     Status: None   Collection Time: 09/07/16 12:00 AM  Result Value Ref Range   Hemoglobin A1C 5.2      PHQ2/9: Depression screen [facility] 2/9 09/14/2016 02/12/2016 08/11/2015 03/03/2015 08/29/2014  Decreased Interest 0 0 0 0 0  Down, Depressed, Hopeless 0 0 0 0 0  PHQ - 2 Score 0 0 0 0 0  Altered sleeping - - 0 - -  Tired, decreased energy - - 0 - -  Change in appetite - - 0 - -  Feeling bad or failure about yourself  - - 0 - -  Trouble concentrating - - 0 - -  Moving slowly or fidgety/restless - - 0 - -  Suicidal thoughts - - 0 - -  PHQ-9 Score - - 0 - -     Fall Risk: Fall Risk  09/14/2016 02/12/2016 03/03/2015 08/29/2014  Falls in the past year? No No No No     Functional Status Survey: Is the patient deaf or have difficulty hearing?: No Does the patient have difficulty seeing, even when wearing glasses/contacts?: No Does the patient have difficulty concentrating, remembering, or making decisions?: No Does  the patient have difficulty walking or climbing stairs?: No Does the patient have difficulty dressing or bathing?: No Does the patient have difficulty doing errands alone such as visiting a doctor's office or shopping?: No    Assessment & Plan  1. Hypertension, benign  - hydrochlorothiazide (HYDRODIURIL) 12.5 MG tablet; Take 1 tablet (12.5 mg total) by mouth daily.  Dispense: 30 tablet; Refill: 0 - CBC With Differential - Comp. Metabolic Panel (12) - TSH  2. Dyslipidemia  Reviewed labs done at work.   3. Obesity (BMI 30-39.9)  Discussed with the patient the risk posed by an increased BMI. Discussed importance of portion control, calorie counting and at least 150 minutes of physical activity weekly. Avoid sweet beverages and drink more water. Eat at least 6 servings of fruit and vegetables daily   4. Angle-closure glaucoma, mild stage  Continue follow up with ophthalmology  5. Needs flu shot  - Flu Vaccine QUAD 36+ mos IM  6. Situational depression  - escitalopram (LEXAPRO) 5 MG tablet; Take 1 tablet (5 mg total) by mouth daily.  Dispense: 30 tablet; Refill: 0  7. Chest pain at rest  Sister has heart disease - age 52 yo - chest pain last week, advised referral to cardiologist but she would like to have labs and EKG and if pain returns go to [facility] or let us know - C-reactive protein - EKG 12-Lead

## 2016-09-14 NOTE — Patient Instructions (Signed)
[email or web address]

## 2016-09-15 LAB — CBC WITH DIFFERENTIAL
BASOS: 0 %
Basophils Absolute: 0 10*3/uL (ref 0.0–0.2)
EOS (ABSOLUTE): 0.1 10*3/uL (ref 0.0–0.4)
EOS: 1 %
HEMATOCRIT: 43.2 % (ref 34.0–46.6)
HEMOGLOBIN: 14.4 g/dL (ref 11.1–15.9)
IMMATURE GRANS (ABS): 0 10*3/uL (ref 0.0–0.1)
IMMATURE GRANULOCYTES: 0 %
LYMPHS: 27 %
Lymphocytes Absolute: 1.9 10*3/uL (ref 0.7–3.1)
MCH: 28.6 pg (ref 26.6–33.0)
MCHC: 33.3 g/dL (ref 31.5–35.7)
MCV: 86 fL (ref 79–97)
MONOCYTES: 7 %
Monocytes Absolute: 0.5 10*3/uL (ref 0.1–0.9)
Neutrophils Absolute: 4.7 10*3/uL (ref 1.4–7.0)
Neutrophils: 65 %
RBC: 5.03 x10E6/uL (ref 3.77–5.28)
RDW: 14 % (ref 12.3–15.4)
WBC: 7.3 10*3/uL (ref 3.4–10.8)

## 2016-09-15 LAB — TSH: TSH: 0.919 u[IU]/mL (ref 0.450–4.500)

## 2016-09-15 LAB — COMP. METABOLIC PANEL (12)
A/G RATIO: 2.1 (ref 1.2–2.2)
ALBUMIN: 4.6 g/dL (ref 3.5–5.5)
AST: 23 IU/L (ref 0–40)
Alkaline Phosphatase: 74 IU/L (ref 39–117)
BILIRUBIN TOTAL: 1 mg/dL (ref 0.0–1.2)
BUN / CREAT RATIO: 15 (ref 9–23)
BUN: 11 mg/dL (ref 6–24)
CALCIUM: 10.2 mg/dL (ref 8.7–10.2)
CHLORIDE: 107 mmol/L — AB (ref 96–106)
Creatinine, Ser: 0.73 mg/dL (ref 0.57–1.00)
GFR calc non Af Amer: 94 mL/min/{1.73_m2} (ref >59)
GFR, EST AFRICAN AMERICAN: 109 mL/min/{1.73_m2} (ref >59)
GLOBULIN, TOTAL: 2.2 g/dL (ref 1.5–4.5)
Glucose: 86 mg/dL (ref 65–99)
Potassium: 4.6 mmol/L (ref 3.5–5.2)
SODIUM: 146 mmol/L — AB (ref 134–144)
TOTAL PROTEIN: 6.8 g/dL (ref 6.0–8.5)

## 2016-09-15 LAB — C-REACTIVE PROTEIN: CRP: 3.8 mg/L (ref 0.0–4.9)

## 2016-10-11 ENCOUNTER — Other Ambulatory Visit: Payer: Self-pay | Admitting: Family Medicine

## 2016-10-11 DIAGNOSIS — I10 Essential (primary) hypertension: Secondary | ICD-10-CM

## 2016-10-11 DIAGNOSIS — F4321 Adjustment disorder with depressed mood: Secondary | ICD-10-CM

## 2016-10-11 NOTE — Telephone Encounter (Signed)
Patient requesting refill of Lexapro and HCTZ to [facility].

## 2016-10-18 ENCOUNTER — Encounter: Payer: 59 | Admitting: Family Medicine

## 2016-10-19 ENCOUNTER — Ambulatory Visit (INDEPENDENT_AMBULATORY_CARE_PROVIDER_SITE_OTHER): Payer: 59 | Admitting: Family Medicine

## 2016-10-19 ENCOUNTER — Encounter: Payer: Self-pay | Admitting: Family Medicine

## 2016-10-19 VITALS — BP 124/82 | HR 92 | Temp 97.8°F | Resp 16 | Ht 62.0 in | Wt 200.0 lb

## 2016-10-19 DIAGNOSIS — Z8262 Family history of osteoporosis: Secondary | ICD-10-CM | POA: Diagnosis not present

## 2016-10-19 DIAGNOSIS — Z1239 Encounter for other screening for malignant neoplasm of breast: Secondary | ICD-10-CM

## 2016-10-19 DIAGNOSIS — Z13820 Encounter for screening for osteoporosis: Secondary | ICD-10-CM

## 2016-10-19 DIAGNOSIS — R928 Other abnormal and inconclusive findings on diagnostic imaging of breast: Secondary | ICD-10-CM | POA: Diagnosis not present

## 2016-10-19 DIAGNOSIS — Z1231 Encounter for screening mammogram for malignant neoplasm of breast: Secondary | ICD-10-CM | POA: Diagnosis not present

## 2016-10-19 DIAGNOSIS — Z124 Encounter for screening for malignant neoplasm of cervix: Secondary | ICD-10-CM

## 2016-10-19 DIAGNOSIS — Z01419 Encounter for gynecological examination (general) (routine) without abnormal findings: Secondary | ICD-10-CM

## 2016-10-19 NOTE — Progress Notes (Addendum)
Name: Veronica Morton   MRN: [id number]    DOB: [birth year]   Date:10/19/2016       Progress Note  Subjective  Chief Complaint  Chief Complaint  Patient presents with  . Annual Exam    HPI  Patient presents for annual CPE.  We reviewed her lab work from 09/14/2016.  Diet: Struggles to not eat, then she gives in and eats too much. Breakfast - has protein shake, sausage biscuits, sometimes fruit (cantalope, watermelon, strawberries), lunch is usually light when she's working, if she's at home sometimes eats out or has a heavier meal, dinner is usually home cooked - baked chicken, 1x/wk has fish, starch of some kind, and a vegetable. Does have sweets a few times a week. Exercise: Walks at least 4 times for 30 minutes - used to do more before her parents got sick, doesn't have time to do any more now.  USPSTF grade A and B recommendations Depression: Feeling overwhelmed with being primary caregiver for her aging parents.  She was prescribed Lexapro, but it gave her a headache. She would like to wait to discuss with Dr. Ancil Boozer before beginning any new medications. Depression screen [facility] 2/9 09/14/2016 02/12/2016 08/11/2015 03/03/2015 08/29/2014  Decreased Interest 0 0 0 0 0  Down, Depressed, Hopeless 0 0 0 0 0  PHQ - 2 Score 0 0 0 0 0  Altered sleeping - - 0 - -  Tired, decreased energy - - 0 - -  Change in appetite - - 0 - -  Feeling bad or failure about yourself  - - 0 - -  Trouble concentrating - - 0 - -  Moving slowly or fidgety/restless - - 0 - -  Suicidal thoughts - - 0 - -  PHQ-9 Score - - 0 - -   Hypertension: BP Readings from Last 3 Encounters:  10/19/16 124/82  09/14/16 (!) 162/82  03/17/16 (!) 146/78   Obesity:  She would like to discuss other options as she only lost a few pounds on Contrave. Provided Saxenda and Belviq information, she will schedule follow up with Dr. Ancil Boozer to discuss after reviewing the options. No personal or family history of thyroid cancers or  pancreatitis. Wt Readings from Last 3 Encounters:  10/19/16 200 lb (90.7 kg)  09/14/16 200 lb 9.6 oz (91 kg)  03/17/16 195 lb (88.5 kg)   BMI Readings from Last 3 Encounters:  10/19/16 36.58 kg/m  09/14/16 36.69 kg/m  03/17/16 35.67 kg/m    Alcohol: Has wine once every now and then - 3-4 times a year Tobacco use: None HIV, hep C: Negative Hep C 2014.  HIV screen - we will wait on this per pt request STD testing and prevention (chl/gon/syphilis): No concerns for this today Intimate partner violence: No concerns Sexual History/Pain during Intercourse: Married Menstrual History/LMP: Postmenopausal Incontinence Symptoms: May have small amount of stress incontinence if bladder is already full, otherwise no concerns. Advanced Care Planning: A voluntary discussion about advance care planning including the explanation and discussion of advance directives.  Discussed health care proxy and Living will, and the patient was able to identify a health care proxy as Timothy Townsel (Husband).  Patient does not have a living will at present time. If patient does have living will, I have requested they bring this to the clinic to be scanned in to their chart.  Breast cancer: Will order mammogram for February 2019.  Has hx complicated cyst on left breast. HM Mammogram  Date Value Ref  Range Status  02/20/2014 Normal  Final    BRCA gene screening: Mother has hx breast cancer in her 33's Cervical cancer screening: Normal Pap 3 years ago, will repeat today. Osteoporosis: Mother has history of osteoporosis, would like early screening. No results found for: HMDEXASCAN  Fall prevention/vitamin D: Takes Calcium w/ Vit D.   Lipids:  Lab Results  Component Value Date   CHOL 185 09/07/2016   CHOL 204 (H) 08/14/2015   CHOL 189 09/03/2014   Lab Results  Component Value Date   HDL 45 09/07/2016   HDL 46 08/14/2015   HDL 43 09/03/2014   Lab Results  Component Value Date   LDLCALC 119 09/07/2016    LDLCALC 136 (H) 08/14/2015   LDLCALC 125 (H) 09/03/2014   Lab Results  Component Value Date   TRIG 106 09/07/2016   TRIG 109 08/14/2015   TRIG 104 09/03/2014   Lab Results  Component Value Date   CHOLHDL 4.4 08/14/2015   CHOLHDL 4.4 09/03/2014   No results found for: LDLDIRECT  Glucose:  Glucose  Date Value Ref Range Status  09/14/2016 86 65 - 99 mg/dL Final  08/14/2015 95 65 - 99 mg/dL Final  09/03/2014 99 65 - 99 mg/dL Final   Glucose, Bld  Date Value Ref Range Status  03/09/2013 135 (H) 70 - 99 mg/dL Final  03/08/2013 172 (H) 70 - 99 mg/dL Final  03/01/2013 97 70 - 99 mg/dL Final    Skin cancer: Saw a dermatologist  02/2016 and had area removed on LEFT side of nose, has not needed to return.  No other concerning lesions Colorectal cancer: Had colonoscopy back in 2015, 10 year plan. ECG: Have ECG on file from 2018   Patient Active Problem List   Diagnosis Date Noted  . 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 Left breast mass 03/18/2016  . Abnormal mammogram of left breast 08/05/2015  . History of skin cancer of unknown type 01/22/2015  . Dyslipidemia 08/29/2014  . H/O knee surgery 08/29/2014  . Menopause 08/29/2014  . History of anemia 08/29/2014  . Dysmetabolic syndrome 24/58/0998  . Decreased ferritin 08/29/2014  . Obesity (BMI 30-39.9) 08/29/2014  . 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 History of pneumonia 12/28/2012    Past Surgical History:  Procedure Laterality Date  . APPENDECTOMY    . BREAST BIOPSY Left 2018   COMPLETED IN [facility] - NEG  . GLAUCOMA SURGERY Right 02/2016  . HEAD & NECK SKIN LESION EXCISIONAL BIOPSY  01/22/2015   Dr. Grace Bushy at [facility]  . JOINT REPLACEMENT    . MASS EXCISION Left    ankle/ Dr Bary Castilla  . TOTAL KNEE ARTHROPLASTY  08/04/2011   Procedure: TOTAL KNEE ARTHROPLASTY;  Surgeon: Ninetta Lights, MD;  Location: [place];  Service: Orthopedics;  Laterality: Right;  . TOTAL KNEE  ARTHROPLASTY Left 03/07/2013   Procedure: LEFT TOTAL KNEE ARTHROPLASTY;  Surgeon: Ninetta Lights, MD;  Location: [place];  Service: Orthopedics;  Laterality: Left;  . TUBAL LIGATION      Family History  Problem Relation Age of Onset  . Rectal cancer Father 73  . Diabetes Father   . Diabetes Mother   . Breast cancer Mother 3  . Diabetes Sister   . Heart disease Sister   . Hypertension Brother   . Colon cancer Paternal Grandmother     Social History   Social History  . Marital status: Single    Spouse name: N/A  .  Number of children: N/A  . Years of education: N/A   Occupational History  . Not on file.   Social History Main Topics  . Smoking status: Never Smoker  . Smokeless tobacco: Never Used  . Alcohol use 0.0 oz/week     Comment: occassionally-social  . Drug use: No  . Sexual activity: Not Currently   Other Topics Concern  . Not on file   Social History Narrative   Married, husband is 33 years older, and had a hemorrhagic stroke on July 4th, 2018 and still has left side weakness   Parents are aging and having more medical problems.    Works Conservator, museum/gallery at [employer]     Current Outpatient Prescriptions:  .  Biotin 10000 MCG TABS, Take by mouth., Disp: , Rfl:  .  calcium-vitamin D 250-100 MG-UNIT tablet, Take 1 tablet by mouth 2 (two) times daily., Disp: , Rfl:  .  Carboxymethylcellul-Glycerin (REFRESH OPTIVE OP), Apply to eye., Disp: , Rfl:  .  cetirizine (ZYRTEC) 10 MG tablet, Take 10 mg by mouth as needed. , Disp: , Rfl:  .  dorzolamide (TRUSOPT) 2 % ophthalmic solution, 1 drop 3 (three) times daily., Disp: , Rfl:  .  Doxylamine Succinate, Sleep, (SLEEP AID PO), Take by mouth., Disp: , Rfl:  .  hydrochlorothiazide (HYDRODIURIL) 12.5 MG tablet, TAKE 1 TABLET BY MOUTH EVERY DAY, Disp: 30 tablet, Rfl: 0 .  valACYclovir (VALTREX) 1000 MG tablet, Take 1 tablet (1,000 mg total) by mouth 2 (two) times daily. Takes 1 pill bid prn fever blisters, Disp: 30 tablet, Rfl:  0 .  vitamin B-12 (CYANOCOBALAMIN) 1000 MCG tablet, Take 1,000 mcg by mouth daily., Disp: , Rfl:  .  escitalopram (LEXAPRO) 5 MG tablet, TAKE 1 TABLET BY MOUTH EVERY DAY (Patient not taking: Reported on 10/19/2016), Disp: 30 tablet, Rfl: 0  No Known Allergies   ROS Constitutional: Negative for fever or weight change.  Respiratory: Negative for cough and shortness of breath.   Cardiovascular: Negative for chest pain or palpitations.  Gastrointestinal: Negative for abdominal pain, no bowel changes.  Musculoskeletal: Negative for gait problem or joint swelling.  Skin: Negative for rash.  Neurological: Negative for dizziness or headache.  No other specific complaints in a complete review of systems (except as listed in HPI above).   Objective  Vitals:   10/19/16 0917  BP: 124/82  Pulse: 92  Resp: 16  Temp: 97.8 F (36.6 C)  TempSrc: Oral  SpO2: 97%  Weight: 200 lb (90.7 kg)  Height: '5\' 2"'  (1.575 m)    Body mass index is 36.58 kg/m.  Physical Exam Constitutional: Patient appears well-developed and well-nourished. No distress.  HENT: Head: Normocephalic and atraumatic. Ears: B TMs ok, no erythema or effusion; Nose: Nose normal. Mouth/Throat: Oropharynx is clear and moist. No oropharyngeal exudate.  Eyes: Conjunctivae and EOM are normal. Pupils are equal, round, and reactive to light. No scleral icterus.  Neck: Normal range of motion. Neck supple. No JVD present. No thyromegaly present.  Cardiovascular: Normal rate, regular rhythm and normal heart sounds.  No murmur heard. No BLE edema. Pulmonary/Chest: Effort normal and breath sounds normal. No respiratory distress. Abdominal: Soft. Bowel sounds are normal, no distension. There is no tenderness. no masses Breast: no lumps or masses, no nipple discharge or rashes FEMALE GENITALIA:  External genitalia normal External urethra normal Vaginal vault normal without discharge or lesions Cervix - OS is difficult to visualize, however  cervix appears normal without discharge or lesions Bimanual exam  normal without masses RECTAL: no rectal masses or hemorrhoids Musculoskeletal: Normal range of motion, no joint effusions. No gross deformities Neurological: he is alert and oriented to person, place, and time. No cranial nerve deficit. Coordination, balance, strength, speech and gait are normal.  Skin: Skin is warm and dry. No rash noted. No erythema.  Psychiatric: Patient has a normal mood and affect. behavior is normal. Judgment and thought content normal.  Recent Results (from the past 2160 hour(s))  Basic metabolic panel     Status: None   Collection Time: 09/07/16 12:00 AM  Result Value Ref Range   Glucose 93    Creatinine 0.7 0.5 - 1.1  Lipid panel     Status: None   Collection Time: 09/07/16 12:00 AM  Result Value Ref Range   Triglycerides 106 40 - 160   Cholesterol 185 0 - 200   HDL 45 35 - 70   LDL Cholesterol 119   Hemoglobin A1c     Status: None   Collection Time: 09/07/16 12:00 AM  Result Value Ref Range   Hemoglobin A1C 5.2   CBC With Differential     Status: None   Collection Time: 09/14/16 11:37 AM  Result Value Ref Range   WBC 7.3 3.4 - 10.8 x10E3/uL   RBC 5.03 3.77 - 5.28 x10E6/uL   Hemoglobin 14.4 11.1 - 15.9 g/dL   Hematocrit 43.2 34.0 - 46.6 %   MCV 86 79 - 97 fL   MCH 28.6 26.6 - 33.0 pg   MCHC 33.3 31.5 - 35.7 g/dL   RDW 14.0 12.3 - 15.4 %   Neutrophils 65 Not Estab. %   Lymphs 27 Not Estab. %   Monocytes 7 Not Estab. %   Eos 1 Not Estab. %   Basos 0 Not Estab. %   Neutrophils Absolute 4.7 1.4 - 7.0 x10E3/uL   Lymphocytes Absolute 1.9 0.7 - 3.1 x10E3/uL   Monocytes Absolute 0.5 0.1 - 0.9 x10E3/uL   EOS (ABSOLUTE) 0.1 0.0 - 0.4 x10E3/uL   Basophils Absolute 0.0 0.0 - 0.2 x10E3/uL   Immature Granulocytes 0 Not Estab. %   Immature Grans (Abs) 0.0 0.0 - 0.1 x10E3/uL  Comp. Metabolic Panel (12)     Status: Abnormal   Collection Time: 09/14/16 11:37 AM  Result Value Ref Range   Glucose  86 65 - 99 mg/dL   BUN 11 6 - 24 mg/dL   Creatinine, Ser 0.73 0.57 - 1.00 mg/dL   GFR calc non Af Amer 94 >59 mL/min/1.73   GFR calc Af Amer 109 >59 mL/min/1.73   BUN/Creatinine Ratio 15 9 - 23   Sodium 146 (H) 134 - 144 mmol/L   Potassium 4.6 3.5 - 5.2 mmol/L   Chloride 107 (H) 96 - 106 mmol/L   Calcium 10.2 8.7 - 10.2 mg/dL   Total Protein 6.8 6.0 - 8.5 g/dL   Albumin 4.6 3.5 - 5.5 g/dL   Globulin, Total 2.2 1.5 - 4.5 g/dL   Albumin/Globulin Ratio 2.1 1.2 - 2.2   Bilirubin Total 1.0 0.0 - 1.2 mg/dL   Alkaline Phosphatase 74 39 - 117 IU/L   AST 23 0 - 40 IU/L  TSH     Status: None   Collection Time: 09/14/16 11:37 AM  Result Value Ref Range   TSH 0.919 0.450 - 4.500 uIU/mL  C-reactive protein     Status: None   Collection Time: 09/14/16 11:37 AM  Result Value Ref Range   CRP 3.8 0.0 - 4.9 mg/L  PHQ2/9: Depression screen [facility] 2/9 09/14/2016 02/12/2016 08/11/2015 03/03/2015 08/29/2014  Decreased Interest 0 0 0 0 0  Down, Depressed, Hopeless 0 0 0 0 0  PHQ - 2 Score 0 0 0 0 0  Altered sleeping - - 0 - -  Tired, decreased energy - - 0 - -  Change in appetite - - 0 - -  Feeling bad or failure about yourself  - - 0 - -  Trouble concentrating - - 0 - -  Moving slowly or fidgety/restless - - 0 - -  Suicidal thoughts - - 0 - -  PHQ-9 Score - - 0 - -    Fall Risk: Fall Risk  09/14/2016 02/12/2016 03/03/2015 08/29/2014  Falls in the past year? No No No No   Assessment & Plan  1. Well woman exam with routine gynecological exam -USPSTF grade A and B recommendations reviewed with patient; age-appropriate recommendations, preventive care, screening tests, etc discussed and encouraged; healthy living encouraged; see AVS for patient education given to patient -Discussed importance of 150 minutes of physical activity weekly, eat two servings of fish weekly, eat one serving of tree nuts ( cashews, pistachios, pecans, almonds.Marland Kitchen) every other day, eat 6 servings of fruit/vegetables daily and  drink plenty of water and avoid sweet beverages.   2. Abnormal mammogram of left breast - MM DIGITAL SCREENING BILATERAL; Future  3. Breast cancer screening - MM DIGITAL SCREENING BILATERAL; Future  4. Osteoporosis screening - HM DEXA SCAN  5. Cervical cancer screening - Pap IG w/ reflex to HPV when ASC-U - Difficulty visualizing Os, Pap performed, will call patient if recollection is needed based on results.  6. Family history of osteoporosis - HM DEXA SCAN  I have reviewed this encounter including the documentation in this note and/or discussed this patient with the Johney Maine, FNP, NP-C. I am certifying that I agree with the content of this note as supervising physician.  Steele Sizer, MD [facility] 10/20/2016, 7:43 AM

## 2016-10-19 NOTE — Patient Instructions (Addendum)
Belviq - by mouth medication  Liraglutide injection (Weight Management) What is this medicine? LIRAGLUTIDE (LIR a GLOO tide) is used with a reduced calorie diet and exercise to help you lose weight. This medicine may be used for other purposes; ask your health care provider or pharmacist if you have questions. COMMON BRAND NAME(S): Saxenda What should I tell my health care provider before I take this medicine? They need to know if you have any of these conditions: -endocrine tumors (MEN 2) or if someone in your family had these tumors -gallbladder disease -high cholesterol -history of alcohol abuse problem -history of pancreatitis -kidney disease or if you are on dialysis -liver disease -previous swelling of the tongue, face, or lips with difficulty breathing, difficulty swallowing, hoarseness, or tightening of the throat -stomach problems -suicidal thoughts, plans, or attempt; a previous suicide attempt by you or a family member -thyroid cancer or if someone in your family had thyroid cancer -an unusual or allergic reaction to liraglutide, other medicines, foods, dyes, or preservatives -pregnant or trying to get pregnant -breast-feeding How should I use this medicine? This medicine is for injection under the skin of your upper leg, stomach area, or upper arm. You will be taught how to prepare and give this medicine. Use exactly as directed. Take your medicine at regular intervals. Do not take it more often than directed. It is important that you put your used needles and syringes in a special sharps container. Do not put them in a trash can. If you do not have a sharps container, call your pharmacist or healthcare provider to get one. A special MedGuide will be given to you by the pharmacist with each prescription and refill. Be sure to read this information carefully each time. Talk to your pediatrician regarding the use of this medicine in children. Special care may be  needed. Overdosage: If you think you have taken too much of this medicine contact a poison control center or emergency room at once. NOTE: This medicine is only for you. Do not share this medicine with others. What if I miss a dose? If you miss a dose, take it as soon as you can. If it is almost time for your next dose, take only that dose. Do not take double or extra doses. If you miss your dose for 3 days or more, call your doctor or health care professional to talk about how to restart this medicine. What may interact with this medicine? -insulin and other medicines for diabetes This list may not describe all possible interactions. Give your health care provider a list of all the medicines, herbs, non-prescription drugs, or dietary supplements you use. Also tell them if you smoke, drink alcohol, or use illegal drugs. Some items may interact with your medicine. What should I watch for while using this medicine? Visit your doctor or health care professional for regular checks on your progress. This medicine is intended to be used in addition to a healthy diet and appropriate exercise. The best results are achieved this way. Do not increase or in any way change your dose without consulting your doctor or health care professional. Drink plenty of fluids while taking this medicine. Check with your doctor or health care professional if you get an attack of severe diarrhea, nausea, and vomiting. The loss of too much body fluid can make it dangerous for you to take this medicine. This medicine may affect blood sugar levels. If you have diabetes, check with your doctor or health care  professional before you change your diet or the dose of your diabetic medicine. Patients and their families should watch out for worsening depression or thoughts of suicide. Also watch out for sudden changes in feelings such as feeling anxious, agitated, panicky, irritable, hostile, aggressive, impulsive, severely restless, overly  excited and hyperactive, or not being able to sleep. If this happens, especially at the beginning of treatment or after a change in dose, call your health care professional. What side effects may I notice from receiving this medicine? Side effects that you should report to your doctor or health care professional as soon as possible: -allergic reactions like skin rash, itching or hives, swelling of the face, lips, or tongue -breathing problems -diarrhea that continues or is severe -lump or swelling on the neck -severe nausea -signs and symptoms of infection like fever or chills; cough; sore throat; pain or trouble passing urine -signs and symptoms of low blood sugar such as feeling anxious, confusion, dizziness, increased hunger, unusually weak or tired, sweating, shakiness, cold, irritable, headache, blurred vision, fast heartbeat, loss of consciousness -signs and symptoms of kidney injury like trouble passing urine or change in the amount of urine -trouble swallowing -unusual stomach upset or pain -vomiting Side effects that usually do not require medical attention (report to your doctor or health care professional if they continue or are bothersome): -constipation -decreased appetite -diarrhea -fatigue -headache -nausea -pain, redness, or irritation at site where injected -stomach upset -stuffy or runny nose This list may not describe all possible side effects. Call your doctor for medical advice about side effects. You may report side effects to FDA at 1-800-FDA-1088. Where should I keep my medicine? Keep out of the reach of children. Store unopened pen in a refrigerator between 2 and 8 degrees C (36 and 46 degrees F). Do not freeze or use if the medicine has been frozen. Protect from light and excessive heat. After you first use the pen, it can be stored at room temperature between 15 and 30 degrees C (59 and 86 degrees F) or in a refrigerator. Throw away your used pen after 30 days or  after the expiration date, whichever comes first. Do not store your pen with the needle attached. If the needle is left on, medicine may leak from the pen. NOTE: This sheet is a summary. It may not cover all possible information. If you have questions about this medicine, talk to your doctor, pharmacist, or health care provider.  2018 Elsevier/Gold Standard (2016-01-22 14:41:37)  Preventive Care 40-64 Years, Female Preventive care refers to lifestyle choices and visits with your health care provider that can promote health and wellness. What does preventive care include?  A yearly physical exam. This is also called an annual well check.  Dental exams once or twice a year.  Routine eye exams. Ask your health care provider how often you should have your eyes checked.  Personal lifestyle choices, including: ? Daily care of your teeth and gums. ? Regular physical activity. ? Eating a healthy diet. ? Avoiding tobacco and drug use. ? Limiting alcohol use. ? Practicing safe sex. ? Taking low-dose aspirin daily starting at age 54. ? Taking vitamin and mineral supplements as recommended by your health care provider. What happens during an annual well check? The services and screenings done by your health care provider during your annual well check will depend on your age, overall health, lifestyle risk factors, and family history of disease. Counseling Your health care provider may ask you questions  about your:  Alcohol use.  Tobacco use.  Drug use.  Emotional well-being.  Home and relationship well-being.  Sexual activity.  Eating habits.  Work and work Statistician.  Method of birth control.  Menstrual cycle.  Pregnancy history.  Screening You may have the following tests or measurements:  Height, weight, and BMI.  Blood pressure.  Lipid and cholesterol levels. These may be checked every 5 years, or more frequently if you are over 40 years old.  Skin  check.  Lung cancer screening. You may have this screening every year starting at age 20 if you have a 30-pack-year history of smoking and currently smoke or have quit within the past 15 years.  Fecal occult blood test (FOBT) of the stool. You may have this test every year starting at age 20.  Flexible sigmoidoscopy or colonoscopy. You may have a sigmoidoscopy every 5 years or a colonoscopy every 10 years starting at age 59.  Hepatitis C blood test.  Hepatitis B blood test.  Sexually transmitted disease (STD) testing.  Diabetes screening. This is done by checking your blood sugar (glucose) after you have not eaten for a while (fasting). You may have this done every 1-3 years.  Mammogram. This may be done every 1-2 years. Talk to your health care provider about when you should start having regular mammograms. This may depend on whether you have a family history of breast cancer.  BRCA-related cancer screening. This may be done if you have a family history of breast, ovarian, tubal, or peritoneal cancers.  Pelvic exam and Pap test. This may be done every 3 years starting at age 31. Starting at age 57, this may be done every 5 years if you have a Pap test in combination with an HPV test.  Bone density scan. This is done to screen for osteoporosis. You may have this scan if you are at high risk for osteoporosis.  Discuss your test results, treatment options, and if necessary, the need for more tests with your health care provider. Vaccines Your health care provider may recommend certain vaccines, such as:  Influenza vaccine. This is recommended every year.  Tetanus, diphtheria, and acellular pertussis (Tdap, Td) vaccine. You may need a Td booster every 10 years.  Varicella vaccine. You may need this if you have not been vaccinated.  Zoster vaccine. You may need this after age 7.  Measles, mumps, and rubella (MMR) vaccine. You may need at least one dose of MMR if you were born in 1957  or later. You may also need a second dose.  Pneumococcal 13-valent conjugate (PCV13) vaccine. You may need this if you have certain conditions and were not previously vaccinated.  Pneumococcal polysaccharide (PPSV23) vaccine. You may need one or two doses if you smoke cigarettes or if you have certain conditions.  Meningococcal vaccine. You may need this if you have certain conditions.  Hepatitis A vaccine. You may need this if you have certain conditions or if you travel or work in places where you may be exposed to hepatitis A.  Hepatitis B vaccine. You may need this if you have certain conditions or if you travel or work in places where you may be exposed to hepatitis B.  Haemophilus influenzae type b (Hib) vaccine. You may need this if you have certain conditions.  Talk to your health care provider about which screenings and vaccines you need and how often you need them. This information is not intended to replace advice given to you by  your health care provider. Make sure you discuss any questions you have with your health care provider. Document Released: 01/31/2015 Document Revised: 09/24/2015 Document Reviewed: 11/05/2014 Elsevier Interactive Patient Education  2017 [employer].

## 2016-10-20 LAB — PAP IG W/ RFLX HPV ASCU

## 2016-11-03 ENCOUNTER — Ambulatory Visit: Payer: 59 | Admitting: Family Medicine

## 2016-11-09 ENCOUNTER — Ambulatory Visit: Payer: 59 | Admitting: Family Medicine

## 2016-11-09 ENCOUNTER — Other Ambulatory Visit: Payer: Self-pay | Admitting: Family Medicine

## 2016-11-09 DIAGNOSIS — I10 Essential (primary) hypertension: Secondary | ICD-10-CM

## 2016-11-09 DIAGNOSIS — F4321 Adjustment disorder with depressed mood: Secondary | ICD-10-CM

## 2016-11-09 NOTE — Telephone Encounter (Signed)
Hypertension medication request: Hydrochlorothiazide and Lexapro  Last office visit pertaining to hypertension:  BP Readings from Last 3 Encounters:  10/19/16 124/82  09/14/16 (!) 162/82  03/17/16 (!) 146/78    Lab Results  Component Value Date   CREATININE 0.73 09/14/2016   BUN 11 09/14/2016   NA 146 (H) 09/14/2016   K 4.6 09/14/2016   CL 107 (H) 09/14/2016   CO2 25 08/14/2015    Follow up on 11/15/2016.

## 2016-11-15 ENCOUNTER — Encounter: Payer: Self-pay | Admitting: Family Medicine

## 2016-11-15 ENCOUNTER — Ambulatory Visit (INDEPENDENT_AMBULATORY_CARE_PROVIDER_SITE_OTHER): Payer: 59 | Admitting: Family Medicine

## 2016-11-15 VITALS — BP 130/72 | HR 92 | Temp 98.2°F | Resp 16 | Ht 62.0 in | Wt 204.9 lb

## 2016-11-15 DIAGNOSIS — E669 Obesity, unspecified: Secondary | ICD-10-CM | POA: Diagnosis not present

## 2016-11-15 DIAGNOSIS — H4020X1 Unspecified primary angle-closure glaucoma, mild stage: Secondary | ICD-10-CM

## 2016-11-15 DIAGNOSIS — F4321 Adjustment disorder with depressed mood: Secondary | ICD-10-CM | POA: Diagnosis not present

## 2016-11-15 DIAGNOSIS — E785 Hyperlipidemia, unspecified: Secondary | ICD-10-CM

## 2016-11-15 DIAGNOSIS — I10 Essential (primary) hypertension: Secondary | ICD-10-CM | POA: Diagnosis not present

## 2016-11-15 MED ORDER — LORCASERIN HCL ER 20 MG PO TB24: 1.0000 | ORAL_TABLET | Freq: Every day | ORAL | 2 refills | Status: DC

## 2016-11-15 NOTE — Progress Notes (Signed)
Name: Veronica Morton   MRN: [id number]    DOB: 12-Mar-1960   Date:11/15/2016       Progress Note  Subjective  Chief Complaint  Chief Complaint  Patient presents with  . Obesity    2 week F/U and go over possible medications for weight loss     HPI  Obesity: she started on Contrave on 03/19/2015 at 212 lbs, she stopped taking it October 2017 because she did not noticed any progression of weight loss. She started a program called [employer] with meal planning and life coach, she lost weight on it. She is eating about 1000 calories. She was doing well, lost 17 lbs initially and got down to 191 lbs, however she is a stress eater, but mostly eating on the go to be able to take care of parents. Discussed meal planning, resume physical activity and we will try Belviq.   HTN: bp has been elevated at health screening at work 08/2016 , and also when tried to donate blood recently, it was elevated on her last visit and we gave her HCTZ but she states she thinks related to stress at home, carrying for aging parents, and hospital admission for both of them at the time. She has been out of HCTZ for the past week and bp is at goal today, she still feels overwhelmed but dealing better with her stress and will continue monitor bp at home.   Dyslipidemia :she is on diet only, reviewed labs done at work and at goal. Chest pain resolved  Situational depression: she has stopped medication ( Lexapro ) and is feeling better now, trying to care for herself, going for walks. Still carrying for aging parents.   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 Herbert Deaner every 6 months    Patient Active Problem List   Diagnosis Date Noted  . 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 Left breast mass 03/18/2016  . Abnormal mammogram of left breast 08/05/2015  . History of skin cancer of unknown type 01/22/2015  . Dyslipidemia  08/29/2014  . H/O knee surgery 08/29/2014  . Menopause 08/29/2014  . History of anemia 08/29/2014  . Dysmetabolic syndrome 40/98/1191  . Decreased ferritin 08/29/2014  . Obesity (BMI 30-39.9) 08/29/2014  . 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 History of pneumonia 12/28/2012    Past Surgical History:  Procedure Laterality Date  . APPENDECTOMY    . BREAST BIOPSY Left 2018   COMPLETED IN [facility]  . GLAUCOMA SURGERY Right 02/2016  . HEAD & NECK SKIN LESION EXCISIONAL BIOPSY  01/22/2015   Dr. Grace Bushy at [facility]  . JOINT REPLACEMENT    . MASS EXCISION Left    ankle/ Dr Bary Castilla  . TOTAL KNEE ARTHROPLASTY  08/04/2011   Procedure: TOTAL KNEE ARTHROPLASTY;  Surgeon: Ninetta Lights, MD;  Location: [place];  Service: Orthopedics;  Laterality: Right;  . TOTAL KNEE ARTHROPLASTY Left 03/07/2013   Procedure: LEFT TOTAL KNEE ARTHROPLASTY;  Surgeon: Ninetta Lights, MD;  Location: [place];  Service: Orthopedics;  Laterality: Left;  . TUBAL LIGATION      Family History  Problem Relation Age of Onset  . Rectal cancer Father 33  . Diabetes Father   . Diabetes Mother   . Breast cancer Mother 28  . Osteoporosis Mother   . Diabetes Sister   . Heart disease Sister   .  Hypertension Brother   . Colon cancer Paternal Grandmother     Social History   Social History  . Marital status: Single    Spouse name: N/A  . Number of children: N/A  . Years of education: N/A   Occupational History  . Not on file.   Social History Main Topics  . Smoking status: Never Smoker  . Smokeless tobacco: Never Used  . Alcohol use 0.0 oz/week     Comment: occassionally-social  . Drug use: No  . Sexual activity: Not Currently   Other Topics Concern  . Not on file   Social History Narrative   Married, husband is 44 years older, and had a hemorrhagic stroke on July 4th, 2018 and still has left side weakness   Parents are aging and having more medical problems.     Works Conservator, museum/gallery at [employer]     Current Outpatient Prescriptions:  .  Biotin 10000 MCG TABS, Take by mouth., Disp: , Rfl:  .  calcium-vitamin D 250-100 MG-UNIT tablet, Take 1 tablet by mouth 2 (two) times daily., Disp: , Rfl:  .  Carboxymethylcellul-Glycerin (REFRESH OPTIVE OP), Apply to eye., Disp: , Rfl:  .  cetirizine (ZYRTEC) 10 MG tablet, Take 10 mg by mouth as needed. , Disp: , Rfl:  .  dorzolamide (TRUSOPT) 2 % ophthalmic solution, 1 drop 3 (three) times daily., Disp: , Rfl:  .  Doxylamine Succinate, Sleep, (SLEEP AID PO), Take by mouth., Disp: , Rfl:  .  valACYclovir (VALTREX) 1000 MG tablet, Take 1 tablet (1,000 mg total) by mouth 2 (two) times daily. Takes 1 pill bid prn fever blisters, Disp: 30 tablet, Rfl: 0 .  vitamin B-12 (CYANOCOBALAMIN) 1000 MCG tablet, Take 1,000 mcg by mouth daily., Disp: , Rfl:  .  Lorcaserin HCl ER (BELVIQ XR) 20 MG TB24, Take 1 tablet by mouth daily., Disp: 30 tablet, Rfl: 2  No Known Allergies   ROS  Constitutional: Negative for fever or weight change.  Respiratory: Negative for cough and shortness of breath.   Cardiovascular: Negative for chest pain or palpitations.  Gastrointestinal: Negative for abdominal pain, no bowel changes.  Musculoskeletal: Negative for gait problem or joint swelling.  Skin: Negative for rash.  Neurological: Negative for dizziness or headache.  No other specific complaints in a complete review of systems (except as listed in HPI above).  Objective  Vitals:   11/15/16 1334  BP: 130/72  Pulse: 92  Resp: 16  Temp: 98.2 F (36.8 C)  TempSrc: Oral  SpO2: 97%  Weight: 204 lb 14.4 oz (92.9 kg)  Height: 5\' 2"  (1.575 m)    Body mass index is 37.48 kg/m.  Physical Exam  Constitutional: Patient appears well-developed and well-nourished. Obese  No distress.  HEENT: head atraumatic, normocephalic, pupils equal and reactive to light, neck supple, throat within normal limits Cardiovascular: Normal rate, regular  rhythm and normal heart sounds.  No murmur heard. No BLE edema. Pulmonary/Chest: Effort normal and breath sounds normal. No respiratory distress. Abdominal: Soft.  There is no tenderness. Psychiatric: Patient has a normal mood and affect. behavior is normal. Judgment and thought content normal.   Recent Results (from the past 2160 hour(s))  Basic metabolic panel     Status: None   Collection Time: 09/07/16 12:00 AM  Result Value Ref Range   Glucose 93    Creatinine 0.7 0.5 - 1.1  Lipid panel     Status: None   Collection Time: 09/07/16 12:00 AM  Result Value  Ref Range   Triglycerides 106 40 - 160   Cholesterol 185 0 - 200   HDL 45 35 - 70   LDL Cholesterol 119   Hemoglobin A1c     Status: None   Collection Time: 09/07/16 12:00 AM  Result Value Ref Range   Hemoglobin A1C 5.2   CBC With Differential     Status: None   Collection Time: 09/14/16 11:37 AM  Result Value Ref Range   WBC 7.3 3.4 - 10.8 x10E3/uL   RBC 5.03 3.77 - 5.28 x10E6/uL   Hemoglobin 14.4 11.1 - 15.9 g/dL   Hematocrit 43.2 34.0 - 46.6 %   MCV 86 79 - 97 fL   MCH 28.6 26.6 - 33.0 pg   MCHC 33.3 31.5 - 35.7 g/dL   RDW 14.0 12.3 - 15.4 %   Neutrophils 65 Not Estab. %   Lymphs 27 Not Estab. %   Monocytes 7 Not Estab. %   Eos 1 Not Estab. %   Basos 0 Not Estab. %   Neutrophils Absolute 4.7 1.4 - 7.0 x10E3/uL   Lymphocytes Absolute 1.9 0.7 - 3.1 x10E3/uL   Monocytes Absolute 0.5 0.1 - 0.9 x10E3/uL   EOS (ABSOLUTE) 0.1 0.0 - 0.4 x10E3/uL   Basophils Absolute 0.0 0.0 - 0.2 x10E3/uL   Immature Granulocytes 0 Not Estab. %   Immature Grans (Abs) 0.0 0.0 - 0.1 x10E3/uL  Comp. Metabolic Panel (12)     Status: Abnormal   Collection Time: 09/14/16 11:37 AM  Result Value Ref Range   Glucose 86 65 - 99 mg/dL   BUN 11 6 - 24 mg/dL   Creatinine, Ser 0.73 0.57 - 1.00 mg/dL   GFR calc non Af Amer 94 >59 mL/min/1.73   GFR calc Af Amer 109 >59 mL/min/1.73   BUN/Creatinine Ratio 15 9 - 23   Sodium 146 (H) 134 - 144 mmol/L    Potassium 4.6 3.5 - 5.2 mmol/L   Chloride 107 (H) 96 - 106 mmol/L   Calcium 10.2 8.7 - 10.2 mg/dL   Total Protein 6.8 6.0 - 8.5 g/dL   Albumin 4.6 3.5 - 5.5 g/dL   Globulin, Total 2.2 1.5 - 4.5 g/dL   Albumin/Globulin Ratio 2.1 1.2 - 2.2   Bilirubin Total 1.0 0.0 - 1.2 mg/dL   Alkaline Phosphatase 74 39 - 117 IU/L   AST 23 0 - 40 IU/L  TSH     Status: None   Collection Time: 09/14/16 11:37 AM  Result Value Ref Range   TSH 0.919 0.450 - 4.500 uIU/mL  C-reactive protein     Status: None   Collection Time: 09/14/16 11:37 AM  Result Value Ref Range   CRP 3.8 0.0 - 4.9 mg/L  Pap IG w/ reflex to HPV when ASC-U     Status: None   Collection Time: 10/19/16 12:48 PM  Result Value Ref Range   Clinical Information:      Comment: Other high risk factor, specify NA    LMP:      Comment: N/A   PREV. PAP:      Comment: N/A   PREV. BX:      Comment: N/A   HPV DNA Probe-Source      Comment: None given   STATEMENT OF ADEQUACY:      Comment: SATISFACTORY FOR EVALUATION   INTERPRETATION/RESULT:      Comment: Negative for intraepithelial lesion or malignancy.   Comment:      Comment: This Pap test has been evaluated  with computer assisted technology.    CYTOTECHNOLOGIST:      Comment: JRW, CT(ASCP) CT screening location: [address], [place], [place] [address] EXPLANATORY NOTE:  . The Pap is a screening test for cervical cancer. It is  not a diagnostic test and is subject to false negative  and false positive results. It is most reliable when a  satisfactory sample, regularly obtained, is submitted  with relevant clinical findings and history, and when  the Pap result is evaluated along with historic and  current clinical information. .     PHQ2/9: Depression screen [facility] 2/9 11/15/2016 09/14/2016 02/12/2016 08/11/2015 03/03/2015  Decreased Interest 0 0 0 0 0  Down, Depressed, Hopeless 0 0 0 0 0  PHQ - 2 Score 0 0 0 0 0  Altered sleeping - - - 0 -  Tired, decreased  energy - - - 0 -  Change in appetite - - - 0 -  Feeling bad or failure about yourself  - - - 0 -  Trouble concentrating - - - 0 -  Moving slowly or fidgety/restless - - - 0 -  Suicidal thoughts - - - 0 -  PHQ-9 Score - - - 0 -    Fall Risk: Fall Risk  11/15/2016 09/14/2016 02/12/2016 03/03/2015 08/29/2014  Falls in the past year? No No No No No    Functional Status Survey: Is the patient deaf or have difficulty hearing?: No Does the patient have difficulty seeing, even when wearing glasses/contacts?: No Does the patient have difficulty concentrating, remembering, or making decisions?: No Does the patient have difficulty walking or climbing stairs?: No Does the patient have difficulty dressing or bathing?: No Does the patient have difficulty doing errands alone such as visiting a doctor's office or shopping?: No    Assessment & Plan  1. Hypertension, benign  Off medication, she will monitor bp at home  2. Obesity (BMI 30-39.9)  Discussed all optiosns for weight loss medications including Belviq, Qsymia, Saxenda and Contrave. Discussed risk and benefits of each of them. - Lorcaserin HCl ER (BELVIQ XR) 20 MG TB24; Take 1 tablet by mouth daily.  Dispense: 30 tablet; Refill: 2  3. Situational depression  Doing better, off medications, did not noticed improvement with Lexapro   4. Dyslipidemia  Discussed life style modification   5. Angle-closure glaucoma, mild stage

## 2016-12-27 ENCOUNTER — Ambulatory Visit: Payer: 59 | Admitting: Family Medicine

## 2017-03-12 ENCOUNTER — Other Ambulatory Visit: Payer: Self-pay | Admitting: Family Medicine

## 2017-03-12 DIAGNOSIS — E669 Obesity, unspecified: Secondary | ICD-10-CM

## 2017-03-16 ENCOUNTER — Encounter: Payer: Self-pay | Admitting: Family Medicine

## 2017-03-16 ENCOUNTER — Ambulatory Visit: Payer: 59 | Admitting: Family Medicine

## 2017-03-16 VITALS — BP 136/74 | HR 97 | Temp 98.4°F | Resp 16 | Ht 62.0 in | Wt 202.2 lb

## 2017-03-16 DIAGNOSIS — E669 Obesity, unspecified: Secondary | ICD-10-CM

## 2017-03-16 DIAGNOSIS — I10 Essential (primary) hypertension: Secondary | ICD-10-CM

## 2017-03-16 DIAGNOSIS — F4321 Adjustment disorder with depressed mood: Secondary | ICD-10-CM | POA: Diagnosis not present

## 2017-03-16 MED ORDER — PHENTERMINE-TOPIRAMATE ER 3.75-23 MG PO CP24: 1.0000 | ORAL_CAPSULE | Freq: Every day | ORAL | 0 refills | Status: DC

## 2017-03-16 MED ORDER — PHENTERMINE-TOPIRAMATE ER 7.5-46 MG PO CP24: 1.0000 | ORAL_CAPSULE | Freq: Every day | ORAL | 0 refills | Status: DC

## 2017-03-16 NOTE — Progress Notes (Signed)
Name: Veronica Morton   MRN: [id number]    DOB: 1960-06-07   Date:03/16/2017       Progress Note  Subjective  Chief Complaint  Chief Complaint  Patient presents with  . Follow-up    6 week F/U  . Obesity    Father just passed away 1 week ago and has had time to focus on herself. Patient has lost 3 pounds since last visit.     HPI  Obesity: she started on Contrave on 03/19/2015 at 212 lbs, she stopped taking it October 2017 because she did not noticed any progression of weight loss. She started a program called [employer] with meal planning and life coach, she lost weight on it. She is eating about 1000 calories. She was doing well, lost 17 lbs initially and got down to 191 lbs, however she is a stress eater, and has lost only 3 pounds on belviq. Pt had recently loss of father caused her to stop her dieting and life style modifications. We discussed options and we will try Qsymia  HTN: off HCTZ for months now, and bp is still normal, since we will add Qsymia we will bring her back in 6 weeks to monitor bp. Denies headaches, chest pain, or weakness.   Dyslipidemia: she is on diet only, reviewed labs done at work and at goal. Pt sts drinks protein shake and breakfast and protein bar for lunch and then eats poorly for dinner because she feels starved.   Situational depression: she has stopped medication ( Lexapro ) started Fall 2018, but stopped on her own, Phq 9 is higher today, but father just died about 2 weeks ago. She does not want to take any medications at this time. She just got contacted by hospice for counseling.   Patient Active Problem List   Diagnosis Date Noted  . 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 Left breast mass 03/18/2016  . Abnormal mammogram of left breast 08/05/2015  . History of skin cancer of unknown type 01/22/2015  . Dyslipidemia 08/29/2014  . H/O knee surgery 08/29/2014  . Menopause 08/29/2014  . History of anemia 08/29/2014  . Dysmetabolic  syndrome 84/16/6063  . Decreased ferritin 08/29/2014  . Obesity (BMI 30-39.9) 08/29/2014  . 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 History of pneumonia 12/28/2012    Past Surgical History:  Procedure Laterality Date  . APPENDECTOMY    . BREAST BIOPSY Left 2018   COMPLETED IN [facility] - NEG  . GLAUCOMA SURGERY Right 02/2016  . HEAD & NECK SKIN LESION EXCISIONAL BIOPSY  01/22/2015   Dr. Grace [facility]hy at [facility]  . JOINT REPLACEMENT    . MASS EXCISION Left    ankle/ Dr Bary Castilla  . TOTAL KNEE ARTHROPLASTY  08/04/2011   Procedure: TOTAL KNEE ARTHROPLASTY;  Surgeon: Ninetta Lights, MD;  Location: [place];  Service: Orthopedics;  Laterality: Right;  . TOTAL KNEE ARTHROPLASTY Left 03/07/2013   Procedure: LEFT TOTAL KNEE ARTHROPLASTY;  Surgeon: Ninetta Lights, MD;  Location: [place];  Service: Orthopedics;  Laterality: Left;  . TUBAL LIGATION      Family History  Problem Relation Age of Onset  . Rectal cancer Father 62  . Diabetes Father   . Diabetes Mother   . Breast cancer Mother 32  . Osteoporosis Mother   . Diabetes Sister   . Heart disease Sister   . Hypertension Brother   . Colon cancer Paternal Grandmother     Social History  Socioeconomic History  . Marital status: Single    Spouse name: Not on file  . Number of children: Not on file  . Years of education: Not on file  . Highest education level: Not on file  Social Needs  . Financial resource strain: Not on file  . Food insecurity - worry: Not on file  . Food insecurity - inability: Not on file  . Transportation needs - medical: Not on file  . Transportation needs - non-medical: Not on file  Occupational History  . Not on file  Tobacco Use  . Smoking status: Never Smoker  . Smokeless tobacco: Never Used  Substance and Sexual Activity  . Alcohol use: Yes    Alcohol/week: 0.0 oz    Comment: occassionally-social  . Drug use: No  . Sexual activity: Not Currently  Other Topics  Concern  . Not on file  Social History Narrative   Married, husband is 39 years older, and had a hemorrhagic stroke on July 4th, 2018 and still has left side weakness   Parents are aging and having more medical problems.    Works Conservator, museum/gallery at [employer]     Current Outpatient Medications:  .  BELVIQ XR 20 MG TB24, TAKE 1 TABLET BY MOUTH DAILY, Disp: 30 tablet, Rfl: 0 .  Biotin 10000 MCG TABS, Take by mouth., Disp: , Rfl:  .  calcium-vitamin D 250-100 MG-UNIT tablet, Take 1 tablet by mouth 2 (two) times daily., Disp: , Rfl:  .  Carboxymethylcellul-Glycerin (REFRESH OPTIVE OP), Apply to eye., Disp: , Rfl:  .  cetirizine (ZYRTEC) 10 MG tablet, Take 10 mg by mouth as needed. , Disp: , Rfl:  .  dorzolamide (TRUSOPT) 2 % ophthalmic solution, 1 drop 3 (three) times daily., Disp: , Rfl:  .  Doxylamine Succinate, Sleep, (SLEEP AID PO), Take by mouth., Disp: , Rfl:  .  valACYclovir (VALTREX) 1000 MG tablet, Take 1 tablet (1,000 mg total) by mouth 2 (two) times daily. Takes 1 pill bid prn fever blisters, Disp: 30 tablet, Rfl: 0 .  vitamin B-12 (CYANOCOBALAMIN) 1000 MCG tablet, Take 1,000 mcg by mouth daily., Disp: , Rfl:   No Known Allergies   ROS  Constitutional: Negative for fever or weight change.  Respiratory: Negative for cough and shortness of breath.   Cardiovascular: Negative for chest pain or palpitations.  Gastrointestinal: Negative for abdominal pain, no bowel changes.  Musculoskeletal: Negative for gait problem or joint swelling.  Skin: Negative for rash.  Neurological: Negative for dizziness or headache.  No other specific complaints in a complete review of systems (except as listed in HPI above).  Objective  Vitals:   03/16/17 1126  BP: 136/74  Pulse: 97  Resp: 16  Temp: 98.4 F (36.9 C)  TempSrc: Oral  SpO2: 97%  Weight: 202 lb 3.2 oz (91.7 kg)  Height: 5\' 2"  (1.575 m)    Body mass index is 36.98 kg/m.  Physical Exam  Constitutional: Patient appears  well-developed and well-nourished. Obese  No distress.  HEENT: head atraumatic, normocephalic, pupils equal and reactive to light, neck supple, throat within normal limits Cardiovascular: Normal rate, regular rhythm and normal heart sounds.  No murmur heard. No BLE edema. Pulmonary/Chest: Effort normal and breath sounds normal. No respiratory distress. Abdominal: Soft.  There is no tenderness. Psychiatric: Patient has a normal mood and affect. behavior is normal. Judgment and thought content normal.  PHQ2/9: Depression screen [facility] 2/9 03/16/2017 11/15/2016 09/14/2016 02/12/2016 08/11/2015  Decreased Interest 2 0 0 0  0  Down, Depressed, Hopeless 3 0 0 0 0  PHQ - 2 Score 5 0 0 0 0  Altered sleeping 0 - - - 0  Tired, decreased energy 1 - - - 0  Change in appetite 2 - - - 0  Feeling bad or failure about yourself  0 - - - 0  Trouble concentrating 2 - - - 0  Moving slowly or fidgety/restless 1 - - - 0  Suicidal thoughts 0 - - - 0  PHQ-9 Score 11 - - - 0  Difficult doing work/chores Somewhat difficult - - - -     Fall Risk: Fall Risk  03/16/2017 11/15/2016 09/14/2016 02/12/2016 03/03/2015  Falls in the past year? No No No No No      Functional Status Survey: Is the patient deaf or have difficulty hearing?: No Does the patient have difficulty seeing, even when wearing glasses/contacts?: No Does the patient have difficulty concentrating, remembering, or making decisions?: No Does the patient have difficulty walking or climbing stairs?: No Does the patient have difficulty dressing or bathing?: No Does the patient have difficulty doing errands alone such as visiting a doctor's office or shopping?: No    Assessment & Plan  1. Hypertension, benign  We will monitor for now. Pt has blood pressure cuff at home to check for HBP and monitor for SE HTN.   2. Obesity (BMI 30-39.9)  Discussed with the patient the risk posed by an increased BMI. Discussed importance of portion control, calorie  counting and at least 150 minutes of physical activity weekly. Avoid sweet beverages and drink more water. Eat at least 6 servings of fruit and vegetables daily. Pt sts will ask husband to help more during this increased time of stress so that he can prepare healthy dinners so she does not stress eat more than planned.   Discussed all optiosns for weight loss medications including Belviq, Qsymia, Saxenda and Contrave. Discussed risk and benefits of each of them. - Phentermine-Topiramate (QSYMIA) 3.75-23 MG CP24; Take 1 tablet by mouth daily.  Dispense: 14 capsule; Refill: 0 - Phentermine-Topiramate (QSYMIA) 7.5-46 MG CP24; Take 1 tablet by mouth daily.  Dispense: 30 capsule; Refill: 0  3. Situational depression  She will get counseling through hospice for now.  4. Dislipidemia Discussed weight and plan for following through- asking husband for help and getting assistance with depression to get her mind right.

## 2017-03-16 NOTE — Patient Instructions (Signed)

## 2017-03-17 ENCOUNTER — Telehealth: Payer: Self-pay | Admitting: Family Medicine

## 2017-03-17 NOTE — Telephone Encounter (Signed)
Copied from [place] [phone]. Topic: Quick Communication - See Telephone Encounter >> Mar 17, 2017  4:23 PM Cleaster Corin, NT wrote: CRM for notification. See Telephone encounter for:   03/17/17. Pt. Calling and stating [facility] [place] [facility] from Dr. Ancil Boozer for meds. Phentermine-Topiramate (QSYMIA) 3.75-23 MG CP24 [id number] and Phentermine-Topiramate (QSYMIA) 7.5-46 MG CP24 [id number] in order to fill them.  [id number] - [place],  - [address] [place] [address] Phone: [phone] Fax: [phone]

## 2017-03-18 NOTE — Telephone Encounter (Signed)
Medication has been approved

## 2017-05-09 ENCOUNTER — Encounter: Payer: Self-pay | Admitting: Family Medicine

## 2017-05-09 ENCOUNTER — Ambulatory Visit: Payer: Managed Care, Other (non HMO) | Admitting: Family Medicine

## 2017-05-09 VITALS — BP 130/80 | HR 88 | Resp 16 | Ht 62.0 in | Wt 196.8 lb

## 2017-05-09 DIAGNOSIS — F320 Major depressive disorder, single episode, mild: Secondary | ICD-10-CM

## 2017-05-09 DIAGNOSIS — E669 Obesity, unspecified: Secondary | ICD-10-CM | POA: Diagnosis not present

## 2017-05-09 DIAGNOSIS — I10 Essential (primary) hypertension: Secondary | ICD-10-CM

## 2017-05-09 MED ORDER — SERTRALINE HCL 50 MG PO TABS: 50.0000 mg | ORAL_TABLET | Freq: Every day | ORAL | 1 refills | Status: DC

## 2017-05-09 MED ORDER — PHENTERMINE-TOPIRAMATE ER 7.5-46 MG PO CP24: 1.0000 | ORAL_CAPSULE | Freq: Every day | ORAL | 1 refills | Status: DC

## 2017-05-09 NOTE — Progress Notes (Signed)
Name: Veronica Morton   MRN: [id number]    DOB: [birth year]   Date:05/09/2017       Progress Note  Subjective  Chief Complaint  Chief Complaint  Patient presents with  . Hypertension  . Obesity    HPI  Obesity: she started on Contrave on 03/19/2015 at 212 lbs, she stopped taking it October 2017 because she did not noticed any progression of weight loss. She started a program called [employer] with meal planning and life coach, she lost weight on it. She is eating about 1000 calories. She was doing well, lost 17 lbs initially and got down to 191 lbs, however she is a  stress eater, and has lost only 3 pounds on belviq. Pt had recently lost her father and made her lose motivation, she is back on her diet, started on Qsymia 02/2017 and has lost  7 lbs since, starting weight 204 lbs. She has noticed that she cannot go to bed at 10 pm, not sleepy enough, but able to fall asleep by 11 pm and gets up between 6-7 am. Does not feel tired while taking medication. 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 , Phq 9 was up to 11 back in 02/2017 and today is down to 8, she is willing to try medication again, we will try Zoloft since she is feeling a little anxious on Qsymia, still grieving the loss of her father and also mother having Alzheimer's. She states it affects her mood, losing her temper easily.    Patient Active Problem List   Diagnosis Date Noted  . 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 Left breast mass 03/18/2016  . Abnormal mammogram of left breast 08/05/2015  . History of  skin cancer of unknown type 01/22/2015  . Dyslipidemia 08/29/2014  . H/O knee surgery 08/29/2014  . Menopause 08/29/2014  . History of anemia 08/29/2014  . Dysmetabolic syndrome 95/09/3265  . Decreased ferritin 08/29/2014  . Obesity (BMI 30-39.9) 08/29/2014  . 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 History of pneumonia 12/28/2012    Past Surgical History:  Procedure Laterality Date  . APPENDECTOMY    . BREAST BIOPSY Left 2018   COMPLETED IN [facility] - NEG  . GLAUCOMA SURGERY Right 02/2016  . HEAD & NECK SKIN LESION EXCISIONAL BIOPSY  01/22/2015   Dr. Grace Bushy at [facility]  . JOINT REPLACEMENT    . MASS EXCISION Left    ankle/ Dr Bary Castilla  . TOTAL KNEE ARTHROPLASTY  08/04/2011   Procedure: TOTAL KNEE ARTHROPLASTY;  Surgeon: Ninetta Lights, MD;  Location: [place];  Service: Orthopedics;  Laterality: Right;  . TOTAL KNEE ARTHROPLASTY Left 03/07/2013   Procedure: LEFT TOTAL KNEE ARTHROPLASTY;  Surgeon: Ninetta Lights, MD;  Location: [place];  Service: Orthopedics;  Laterality: Left;  . TUBAL LIGATION      Family History  Problem Relation Age of Onset  . Rectal cancer Father 31  . Diabetes Father   . Diabetes Mother   . Breast  cancer Mother 58  . Osteoporosis Mother   . Diabetes Sister   . Heart disease Sister   . Hypertension Brother   . Colon cancer Paternal Grandmother     Social History:   Married, works, taking care of mother that has Alzheimer's Does not drink, smoke or does drugs   Current Outpatient Medications:  .  Biotin 10000 MCG TABS, Take by mouth., Disp: , Rfl:  .  calcium-vitamin D 250-100 MG-UNIT tablet, Take 1 tablet by mouth 2 (two) times daily., Disp: , Rfl:  .  Carboxymethylcellul-Glycerin (REFRESH OPTIVE OP), Apply to eye., Disp: , Rfl:  .  cetirizine (ZYRTEC) 10 MG tablet, Take 10 mg by mouth as needed. , Disp: , Rfl:  .  dorzolamide (TRUSOPT) 2 % ophthalmic solution, 1 drop 3 (three) times daily., Disp: , Rfl:  .   Doxylamine Succinate, Sleep, (SLEEP AID PO), Take by mouth., Disp: , Rfl:  .  Phentermine-Topiramate (QSYMIA) 7.5-46 MG CP24, Take 1 tablet by mouth daily., Disp: 30 capsule, Rfl: 1 .  valACYclovir (VALTREX) 1000 MG tablet, Take 1 tablet (1,000 mg total) by mouth 2 (two) times daily. Takes 1 pill bid prn fever blisters, Disp: 30 tablet, Rfl: 0 .  vitamin B-12 (CYANOCOBALAMIN) 1000 MCG tablet, Take 1,000 mcg by mouth daily., Disp: , Rfl:  .  sertraline (ZOLOFT) 50 MG tablet, Take 1 tablet (50 mg total) by mouth daily., Disp: 30 tablet, Rfl: 1  No Known Allergies   ROS  Constitutional: Negative for fever , positive for mild weight change.  Respiratory: Negative for cough and shortness of breath.   Cardiovascular: Negative for chest pain or palpitations.  Gastrointestinal: Negative for abdominal pain, no bowel changes.  Musculoskeletal: Negative for gait problem or joint swelling.  Skin: Negative for rash.  Neurological: Negative for dizziness or headache.  No other specific complaints in a complete review of systems (except as listed in HPI above).  Objective  Vitals:   05/09/17 1058  BP: 130/80  Pulse: 88  Resp: 16  SpO2: 96%  Weight: 196 lb 12.8 oz (89.3 kg)  Height: 5\' 2"  (1.575 m)    Body mass index is 36 kg/m.  Physical Exam  Constitutional: Patient appears well-developed and well-nourished. Obese  No distress.  HEENT: head atraumatic, normocephalic, pupils equal and reactive to light, neck supple, throat within normal limits Cardiovascular: Normal rate, regular rhythm and normal heart sounds.  No murmur heard. No BLE edema. Pulmonary/Chest: Effort normal and breath sounds normal. No respiratory distress. Abdominal: Soft.  There is no tenderness. Psychiatric: Patient has a depressed mood, crying during visit, behavior is normal. Judgment and thought content normal.   PHQ2/9: Depression screen [facility] 2/9 05/09/2017 03/16/2017 11/15/2016 09/14/2016 02/12/2016  Decreased  Interest 0 2 0 0 0  Down, Depressed, Hopeless 0 3 0 0 0  PHQ - 2 Score 0 5 0 0 0  Altered sleeping 3 0 - - -  Tired, decreased energy 1 1 - - -  Change in appetite 1 2 - - -  Feeling bad or failure about yourself  2 0 - - -  Trouble concentrating 0 2 - - -  Moving slowly or fidgety/restless 1 1 - - -  Suicidal thoughts 0 0 - - -  PHQ-9 Score 8 11 - - -  Difficult doing work/chores Somewhat difficult Somewhat difficult - - -    Fall Risk: Fall Risk  05/09/2017 03/16/2017 11/15/2016 09/14/2016 02/12/2016  Falls in the past year? No No No  No No    Functional Status Survey: Is the patient deaf or have difficulty hearing?: No Does the patient have difficulty seeing, even when wearing glasses/contacts?: No Does the patient have difficulty concentrating, remembering, or making decisions?: No Does the patient have difficulty walking or climbing stairs?: No Does the patient have difficulty dressing or bathing?: No Does the patient have difficulty doing errands alone such as visiting a doctor's office or shopping?: No   Assessment & Plan  1. Hypertension, benign  Taking medication and bp is at goal   2. Mild major Depression   She is willing to try Zoloft, we will try in the evening. - sertraline (ZOLOFT) 50 MG tablet; Take 1 tablet (50 mg total) by mouth daily.  Dispense: 30 tablet; Refill: 1  3. Obesity (BMI 30-39.9)  Doing better, starting weight 2014, lost almost 7 lbs in 6 weeks, continue Qsymai

## 2017-07-07 ENCOUNTER — Other Ambulatory Visit: Payer: Self-pay | Admitting: Family Medicine

## 2017-07-07 DIAGNOSIS — F320 Major depressive disorder, single episode, mild: Secondary | ICD-10-CM

## 2017-07-07 NOTE — Telephone Encounter (Signed)
Refill request for general medication: Zoloft 50 mg  Last office visit: 05/09/2017  Last physical exam: 11/09/2016  Follow-ups on file. 07/13/2017

## 2017-07-13 ENCOUNTER — Ambulatory Visit: Payer: Managed Care, Other (non HMO) | Admitting: Family Medicine

## 2017-07-13 ENCOUNTER — Encounter: Payer: Self-pay | Admitting: Family Medicine

## 2017-07-13 VITALS — BP 134/78 | HR 99 | Temp 97.9°F | Resp 16 | Ht 62.0 in | Wt 194.3 lb

## 2017-07-13 DIAGNOSIS — E669 Obesity, unspecified: Secondary | ICD-10-CM

## 2017-07-13 DIAGNOSIS — Z789 Other specified health status: Secondary | ICD-10-CM | POA: Diagnosis not present

## 2017-07-13 DIAGNOSIS — E785 Hyperlipidemia, unspecified: Secondary | ICD-10-CM | POA: Diagnosis not present

## 2017-07-13 DIAGNOSIS — R5383 Other fatigue: Secondary | ICD-10-CM

## 2017-07-13 DIAGNOSIS — I10 Essential (primary) hypertension: Secondary | ICD-10-CM | POA: Diagnosis not present

## 2017-07-13 DIAGNOSIS — F325 Major depressive disorder, single episode, in full remission: Secondary | ICD-10-CM | POA: Diagnosis not present

## 2017-07-13 DIAGNOSIS — Z131 Encounter for screening for diabetes mellitus: Secondary | ICD-10-CM | POA: Diagnosis not present

## 2017-07-13 MED ORDER — PHENTERMINE-TOPIRAMATE ER 7.5-46 MG PO CP24: 1.0000 | ORAL_CAPSULE | Freq: Every day | ORAL | 2 refills | Status: DC

## 2017-07-13 NOTE — Progress Notes (Signed)
Name: Veronica Morton   MRN: [id number]    DOB: September 17, 1960   Date:07/13/2017       Progress Note  Subjective  Chief Complaint  Chief Complaint  Patient presents with  . Follow-up    2 month F/U  . Depression    Stopped Zoloft since it didn't help with her and she felt like it wasn't doing anything  . Obesity    Doing well with Qsymia-has losted 3 pounds since last visit    HPI  Obesity: she started on Contrave on 03/19/2015 at 212 lbs, she stopped taking it October 2017 because she did not noticed any progression of weight loss. She started a program called [employer] with meal planning and life coach, she lost weight on it. She is eating about 1000 calories. She was doing well, lost 17 lbs initially and got down to 191 lbs, however she is a  stress eater, and has lost only 3 pounds on belviq. Pt had recently lost her father and made her lose motivation, she is back on her diet, started on Qsymia 02/2017 and had lost  7 lbs since, starting weight 204 lbs. She is down 10 lbs since, continue medication and hard work.   HTN:off HCTZ for months now, and bp is still normal, she is on Qsymia and bp is still at goal, continue to monitor for now. Losing weight. 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 lost her father 02/2017 and mother has Alzheimer's phq9 had gone up and we gave rx of Zoloft, however she took for two days and stopped on her own. Feeling well now. Coping well at this time and does not medication  Patient Active Problem List   Diagnosis Date Noted  . 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 Left breast mass 03/18/2016  . Abnormal mammogram of left breast 08/05/2015  . History of skin cancer of unknown type  01/22/2015  . Dyslipidemia 08/29/2014  . H/O knee surgery 08/29/2014  . Menopause 08/29/2014  . History of anemia 08/29/2014  . Dysmetabolic syndrome 83/66/2947  . Decreased ferritin 08/29/2014  . Obesity (BMI 30-39.9) 08/29/2014  . 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 History of pneumonia 12/28/2012    Past Surgical History:  Procedure Laterality Date  . APPENDECTOMY    . BREAST BIOPSY Left 2018   COMPLETED IN [facility] - NEG  . GLAUCOMA SURGERY Right 02/2016  . HEAD & NECK SKIN LESION EXCISIONAL BIOPSY  01/22/2015   Dr. Grace Bushy at [facility]  . JOINT REPLACEMENT    . MASS EXCISION Left    ankle/ Dr Bary Castilla  . TOTAL KNEE ARTHROPLASTY  08/04/2011   Procedure: TOTAL KNEE ARTHROPLASTY;  Surgeon: Ninetta Lights, MD;  Location: [place];  Service: Orthopedics;  Laterality: Right;  . TOTAL KNEE ARTHROPLASTY Left 03/07/2013   Procedure: LEFT TOTAL KNEE ARTHROPLASTY;  Surgeon: Ninetta Lights, MD;  Location: [place];  Service: Orthopedics;  Laterality: Left;  . TUBAL LIGATION      Family History  Problem Relation Age of Onset  . Rectal cancer Father 68  . Diabetes Father   . Diabetes Mother   . Breast cancer Mother 3  . Osteoporosis Mother   . Diabetes  Sister   . Heart disease Sister   . Hypertension Brother   . Colon cancer Paternal Grandmother     Social History   Socioeconomic History  . Marital status: Single    Spouse name: Not on file  . Number of children: Not on file  . Years of education: Not on file  . Highest education level: Not on file  Occupational History  . Not on file  Social Needs  . Financial resource strain: Not on file  . Food insecurity:    Worry: Not on file    Inability: Not on file  . Transportation needs:    Medical: Not on file    Non-medical: Not on file  Tobacco Use  . Smoking status: Never Smoker  . Smokeless tobacco: Never Used  Substance and Sexual Activity  . Alcohol use: Yes    Alcohol/week:  0.0 oz    Comment: occassionally-social  . Drug use: No  . Sexual activity: Not Currently  Lifestyle  . Physical activity:    Days per week: Not on file    Minutes per session: Not on file  . Stress: Not on file  Relationships  . Social connections:    Talks on phone: Not on file    Gets together: Not on file    Attends religious service: Not on file    Active member of club or organization: Not on file    Attends meetings of clubs or organizations: Not on file    Relationship status: Not on file  . Intimate partner violence:    Fear of current or ex partner: Not on file    Emotionally abused: Not on file    Physically abused: Not on file    Forced sexual activity: Not on file  Other Topics Concern  . Not on file  Social History Narrative   Married, husband is 86 years older, and had a hemorrhagic stroke on July 4th, 2018 and still has left side weakness   Parents are aging and having more medical problems.    Works Conservator, museum/gallery at [employer]     Current Outpatient Medications:  .  Biotin 10000 MCG TABS, Take by mouth., Disp: , Rfl:  .  calcium-vitamin D 250-100 MG-UNIT tablet, Take 1 tablet by mouth 2 (two) times daily., Disp: , Rfl:  .  Carboxymethylcellul-Glycerin (REFRESH OPTIVE OP), Apply to eye., Disp: , Rfl:  .  cetirizine (ZYRTEC) 10 MG tablet, Take 10 mg by mouth as needed. , Disp: , Rfl:  .  dorzolamide (TRUSOPT) 2 % ophthalmic solution, 1 drop 3 (three) times daily., Disp: , Rfl:  .  Doxylamine Succinate, Sleep, (SLEEP AID PO), Take by mouth., Disp: , Rfl:  .  Phentermine-Topiramate (QSYMIA) 7.5-46 MG CP24, Take 1 tablet by mouth daily., Disp: 30 capsule, Rfl: 1 .  valACYclovir (VALTREX) 1000 MG tablet, Take 1 tablet (1,000 mg total) by mouth 2 (two) times daily. Takes 1 pill bid prn fever blisters, Disp: 30 tablet, Rfl: 0 .  vitamin B-12 (CYANOCOBALAMIN) 1000 MCG tablet, Take 1,000 mcg by mouth daily., Disp: , Rfl:  .  sertraline (ZOLOFT) 50 MG tablet, TAKE 1 TABLET  BY MOUTH EVERY DAY (Patient not taking: Reported on 07/13/2017), Disp: 30 tablet, Rfl: 0  No Known Allergies   ROS  Constitutional: Negative for fever or weight change.  Respiratory: Negative for cough and shortness of breath.   Cardiovascular: Negative for chest pain or palpitations.  Gastrointestinal: Negative for abdominal pain, no bowel changes.  Musculoskeletal: Negative for gait problem or joint swelling.  Skin: Negative for rash.  Neurological: Negative for dizziness or headache.  No other specific complaints in a complete review of systems (except as listed in HPI above).  Objective  Vitals:   07/13/17 1209  BP: 134/78  Pulse: 99  Resp: 16  Temp: 97.9 F (36.6 C)  TempSrc: Oral  SpO2: 96%  Weight: 194 lb 4.8 oz (88.1 kg)  Height: '5\' 2"'  (1.575 m)    Body mass index is 35.54 kg/m.  Physical Exam  Constitutional: Patient appears well-developed and well-nourished. Obese  No distress.  HEENT: head atraumatic, normocephalic, pupils equal and reactive to light, neck supple, throat within normal limits Cardiovascular: Normal rate, regular rhythm and normal heart sounds.  No murmur heard. No BLE edema. Pulmonary/Chest: Effort normal and breath sounds normal. No respiratory distress. Abdominal: Soft.  There is no tenderness. Psychiatric: Patient has a normal mood and affect. behavior is normal. Judgment and thought content normal.  PHQ2/9: Depression screen [facility] 2/9 07/13/2017 05/09/2017 03/16/2017 11/15/2016 09/14/2016  Decreased Interest 0 0 2 0 0  Down, Depressed, Hopeless 0 0 3 0 0  PHQ - 2 Score 0 0 5 0 0  Altered sleeping 1 3 0 - -  Tired, decreased energy 0 1 1 - -  Change in appetite 0 1 2 - -  Feeling bad or failure about yourself  0 2 0 - -  Trouble concentrating 0 0 2 - -  Moving slowly or fidgety/restless 0 1 1 - -  Suicidal thoughts 0 0 0 - -  PHQ-9 Score '1 8 11 ' - -  Difficult doing work/chores Not difficult at all Somewhat difficult Somewhat difficult - -      Fall Risk: Fall Risk  07/13/2017 05/09/2017 03/16/2017 11/15/2016 09/14/2016  Falls in the past year? No No No No No     Functional Status Survey: Is the patient deaf or have difficulty hearing?: No Does the patient have difficulty seeing, even when wearing glasses/contacts?: Yes Does the patient have difficulty concentrating, remembering, or making decisions?: No Does the patient have difficulty walking or climbing stairs?: No Does the patient have difficulty dressing or bathing?: No Does the patient have difficulty doing errands alone such as visiting a doctor's office or shopping?: No    Assessment & Plan  1. Hypertension, benign  - Comprehensive metabolic panel - CBC with Differential/Platelet - TSH  2. Obesity (BMI 30-39.9)  Discussed with the patient the risk posed by an increased BMI. Discussed importance of portion control, calorie counting and at least 150 minutes of physical activity weekly. Avoid sweet beverages and drink more water. Eat at least 6 servings of fruit and vegetables daily  - Phentermine-Topiramate (QSYMIA) 7.5-46 MG CP24; Take 1 tablet by mouth daily.  Dispense: 30 capsule; Refill: 2  3. Dyslipidemia  - Lipid panel  4. Mild major depression in remission  [facility])  Doing well at this time  5. Measles, mumps, rubella (MMR) vaccination status unknown  - Measles/Mumps/Rubella Immunity  6. Diabetes mellitus screening  - Hemoglobin A1c  7. Other fatigue  - VITAMIN D 25 Hydroxy (Vit-D Deficiency, Fractures) - Vitamin B12

## 2017-08-11 ENCOUNTER — Other Ambulatory Visit: Payer: Self-pay | Admitting: Family Medicine

## 2017-08-12 LAB — COMPREHENSIVE METABOLIC PANEL
A/G RATIO: 2 (ref 1.2–2.2)
ALBUMIN: 4.3 g/dL (ref 3.5–5.5)
ALK PHOS: 70 IU/L (ref 39–117)
ALT: 34 IU/L — ABNORMAL HIGH (ref 0–32)
AST: 21 IU/L (ref 0–40)
BILIRUBIN TOTAL: 0.7 mg/dL (ref 0.0–1.2)
BUN / CREAT RATIO: 14 (ref 9–23)
BUN: 11 mg/dL (ref 6–24)
CHLORIDE: 107 mmol/L — AB (ref 96–106)
CO2: 22 mmol/L (ref 20–29)
Calcium: 9.7 mg/dL (ref 8.7–10.2)
Creatinine, Ser: 0.76 mg/dL (ref 0.57–1.00)
GFR calc non Af Amer: 89 mL/min/{1.73_m2} (ref >59)
GFR, EST AFRICAN AMERICAN: 103 mL/min/{1.73_m2} (ref >59)
Globulin, Total: 2.2 g/dL (ref 1.5–4.5)
Glucose: 101 mg/dL — ABNORMAL HIGH (ref 65–99)
POTASSIUM: 4 mmol/L (ref 3.5–5.2)
SODIUM: 144 mmol/L (ref 134–144)
TOTAL PROTEIN: 6.5 g/dL (ref 6.0–8.5)

## 2017-08-12 LAB — CBC WITH DIFFERENTIAL/PLATELET
BASOS: 1 %
Basophils Absolute: 0.1 10*3/uL (ref 0.0–0.2)
EOS (ABSOLUTE): 0.3 10*3/uL (ref 0.0–0.4)
Eos: 4 %
HEMOGLOBIN: 14.9 g/dL (ref 11.1–15.9)
Hematocrit: 44.6 % (ref 34.0–46.6)
IMMATURE GRANS (ABS): 0 10*3/uL (ref 0.0–0.1)
Immature Granulocytes: 0 %
LYMPHS ABS: 1.7 10*3/uL (ref 0.7–3.1)
LYMPHS: 27 %
MCH: 28.2 pg (ref 26.6–33.0)
MCHC: 33.4 g/dL (ref 31.5–35.7)
MCV: 85 fL (ref 79–97)
MONOCYTES: 7 %
Monocytes Absolute: 0.4 10*3/uL (ref 0.1–0.9)
NEUTROS ABS: 3.9 10*3/uL (ref 1.4–7.0)
Neutrophils: 61 %
Platelets: 199 10*3/uL (ref 150–450)
RBC: 5.28 x10E6/uL (ref 3.77–5.28)
RDW: 13 % (ref 12.3–15.4)
WBC: 6.5 10*3/uL (ref 3.4–10.8)

## 2017-08-12 LAB — MEASLES/MUMPS/RUBELLA IMMUNITY
MUMPS ABS, IGG: 159 [AU]/ml (ref >10.9)
RUBELLA: 10.5 {index} (ref >0.99)

## 2017-08-12 LAB — HEMOGLOBIN A1C
Est. average glucose Bld gHb Est-mCnc: 108 mg/dL
Hgb A1c MFr Bld: 5.4 % (ref 4.8–5.6)

## 2017-08-12 LAB — VITAMIN B12: Vitamin B-12: 968 pg/mL (ref 232–1245)

## 2017-08-12 LAB — VITAMIN D 25 HYDROXY (VIT D DEFICIENCY, FRACTURES): VIT D 25 HYDROXY: 30.6 ng/mL (ref 30.0–100.0)

## 2017-08-12 LAB — TSH: TSH: 0.627 u[IU]/mL (ref 0.450–4.500)

## 2017-08-12 LAB — LIPID PANEL
Chol/HDL Ratio: 4.2 ratio (ref 0.0–4.4)
Cholesterol, Total: 190 mg/dL (ref 100–199)
HDL: 45 mg/dL (ref >39)
LDL Calculated: 126 mg/dL — ABNORMAL HIGH (ref 0–99)
Triglycerides: 96 mg/dL (ref 0–149)
VLDL Cholesterol Cal: 19 mg/dL (ref 5–40)

## 2017-08-25 ENCOUNTER — Telehealth: Payer: Self-pay

## 2017-08-25 NOTE — Telephone Encounter (Signed)
Copied from [place] [id number]. Topic: Inquiry >> Aug 25, 2017  4:08 PM Conception Chancy, NT wrote: Reason for CRM: patient is calling and is requesting her lab results mailed to her from 08/11/17. Please advise.

## 2017-10-14 ENCOUNTER — Ambulatory Visit: Payer: Managed Care, Other (non HMO) | Admitting: Family Medicine

## 2017-10-14 ENCOUNTER — Encounter: Payer: Self-pay | Admitting: Family Medicine

## 2017-10-14 VITALS — BP 126/72 | HR 87 | Temp 98.1°F | Resp 16 | Ht 62.0 in | Wt 202.1 lb

## 2017-10-14 DIAGNOSIS — I10 Essential (primary) hypertension: Secondary | ICD-10-CM | POA: Diagnosis not present

## 2017-10-14 DIAGNOSIS — E669 Obesity, unspecified: Secondary | ICD-10-CM

## 2017-10-14 DIAGNOSIS — H4020X1 Unspecified primary angle-closure glaucoma, mild stage: Secondary | ICD-10-CM

## 2017-10-14 DIAGNOSIS — Z23 Encounter for immunization: Secondary | ICD-10-CM

## 2017-10-14 DIAGNOSIS — F325 Major depressive disorder, single episode, in full remission: Secondary | ICD-10-CM

## 2017-10-14 DIAGNOSIS — E785 Hyperlipidemia, unspecified: Secondary | ICD-10-CM | POA: Diagnosis not present

## 2017-10-14 MED ORDER — PHENTERMINE-TOPIRAMATE ER 7.5-46 MG PO CP24: 1.0000 | ORAL_CAPSULE | Freq: Every day | ORAL | 0 refills | Status: DC

## 2017-10-14 NOTE — Progress Notes (Signed)
Name: Veronica Morton   MRN: [id number]    DOB: 02/24/1960   Date:10/14/2017       Progress Note  Subjective  Chief Complaint  Chief Complaint  Patient presents with  . Medication Refill  . Hypertension  . Obesity    Has been without her medication for 2 months-needs refill to [facility].  . Depression  . Dyslipidemia    HPI  Obesity: she started on Contrave on 03/19/2015 at 212 lbs, she stopped taking it October 2017 because she did not noticed any progression of weight loss. She started a program called [employer] Life with meal planning and life coach, she lost weight on it. She is eating about 1000 calories. She was doing well, lost 17 lbs initially and got down to 191 lbs, however she is astress eater, and has lost only 3 pounds on belviq. Pt had recently lost herfatherand made her lose motivation, she is back on her diet, started on Qsymia 02/2017 and had lost down to 191 lbs, however having problems with insurance and has been without Qsymia gained weight again since last visit, but we will now send rx as requested to [facility] and recheck weight and bp in 3 months   HTN:off HCTZ for months now, and bp is still normal,she is off Qsymia right now because of insurance problems, but will resume medication, advised to monitor bp and call back to resume Hctz if bp goes above 140/90  Dyslipidemia: last labs back to normal, trying to eat healthier, needs to increase physical activity   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 Left breast mass 03/18/2016  . Abnormal mammogram of left breast  08/05/2015  . History of skin cancer of unknown type 01/22/2015  . Dyslipidemia 08/29/2014  . H/O knee surgery 08/29/2014  . Menopause 08/29/2014  . History of anemia 08/29/2014  . Dysmetabolic syndrome 09/47/0962  . Decreased ferritin 08/29/2014  . Obesity (BMI 30-39.9) 08/29/2014  . 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 History of pneumonia 12/28/2012    Past Surgical History:  Procedure Laterality Date  . APPENDECTOMY    . BREAST BIOPSY Left 2018   COMPLETED IN [facility] - NEG  . GLAUCOMA SURGERY Right 02/2016  . HEAD & NECK SKIN LESION EXCISIONAL BIOPSY  01/22/2015   Dr. Grace Bushy at [facility]  . JOINT REPLACEMENT    . MASS EXCISION Left    ankle/ Dr Bary Castilla  . TOTAL KNEE ARTHROPLASTY  08/04/2011   Procedure: TOTAL KNEE ARTHROPLASTY;  Surgeon: Ninetta Lights, MD;  Location: [place];  Service: Orthopedics;  Laterality: Right;  . TOTAL KNEE ARTHROPLASTY Left 03/07/2013   Procedure: LEFT TOTAL KNEE ARTHROPLASTY;  Surgeon: Ninetta Lights, MD;  Location: [place];  Service: Orthopedics;  Laterality: Left;  . TUBAL LIGATION      Family History  Problem Relation Age of Onset  . Rectal cancer Father 52  . Diabetes Father   . Diabetes Mother   . Breast cancer Mother 68  . Osteoporosis Mother   . Diabetes Sister   . Heart disease Sister   .  Hypertension Brother   . Colon cancer Paternal Grandmother     Social History   Socioeconomic History  . Marital status: Married    Spouse name: Sharlet Salina   . Number of children: 3  . Years of education: Not on file  . Highest education level: Bachelor's degree (e.g., BA, AB, BS)  Occupational History  . Occupation: Financial planner   Social Needs  . Financial resource strain: Not hard at all  . Food insecurity:    Worry: Never true    Inability: Never true  . Transportation needs:    Medical: No    Non-medical: No  Tobacco Use  . Smoking status: Never Smoker  . Smokeless tobacco: Never Used   Substance and Sexual Activity  . Alcohol use: Yes    Alcohol/week: 0.0 standard drinks    Comment: occassionally-social  . Drug use: No  . Sexual activity: Not Currently  Lifestyle  . Physical activity:    Days per week: 4 days    Minutes per session: Not on file  . Stress: Not on file  Relationships  . Social connections:    Talks on phone: More than three times a week    Gets together: More than three times a week    Attends religious service: More than 4 times per year    Active member of club or organization: No    Attends meetings of clubs or organizations: Never    Relationship status: Married  . Intimate partner violence:    Fear of current or ex partner: No    Emotionally abused: No    Physically abused: No    Forced sexual activity: No  Other Topics Concern  . 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  Biotin 10000 MCG TABS, Take by mouth., Disp: , Rfl:  .  calcium-vitamin D 250-100 MG-UNIT tablet, Take 1 tablet by mouth 2 (two) times daily., Disp: , Rfl:  .  Carboxymethylcellul-Glycerin (REFRESH OPTIVE OP), Apply to eye., Disp: , Rfl:  .  cetirizine (ZYRTEC) 10 MG tablet, Take 10 mg by mouth as needed. , Disp: , Rfl:  .  dorzolamide (TRUSOPT) 2 % ophthalmic solution, 1 drop 3 (three) times daily., Disp: , Rfl:  .  Doxylamine Succinate, Sleep, (SLEEP AID PO), Take by mouth., Disp: , Rfl:  .  Phentermine-Topiramate (QSYMIA) 7.5-46 MG CP24, Take 1 tablet by mouth daily., Disp: 90 capsule, Rfl: 0 .  valACYclovir (VALTREX) 1000 MG tablet, Take 1 tablet (1,000 mg total) by mouth 2 (two) times daily. Takes 1 pill bid prn fever blisters, Disp: 30 tablet, Rfl: 0 .  vitamin B-12 (CYANOCOBALAMIN) 1000 MCG tablet, Take 1,000 mcg by mouth daily., Disp: , Rfl:   No  Known Allergies  I personally reviewed active problem list, medication list, allergies, family history, social history with the patient/caregiver today.   ROS  Constitutional: Negative for fever or weight change.  Respiratory: Negative for cough and shortness of breath.   Cardiovascular: Negative for chest pain or palpitations.  Gastrointestinal: Negative for abdominal pain, no bowel changes.  Musculoskeletal: Negative for gait problem or joint swelling.  Skin: Negative for rash.  Neurological: Negative for dizziness or headache.  No other specific complaints in a complete review of  systems (except as listed in HPI above).  Objective   Vitals:   10/14/17 1203  BP: 126/72  Pulse: 87  Resp: 16  Temp: 98.1 F (36.7 C)  TempSrc: Oral  SpO2: 98%  Weight: 202 lb 1.6 oz (91.7 kg)  Height: 5\' 2"  (1.575 m)    Body mass index is 36.96 kg/m.  Physical Exam  Constitutional: Patient appears well-developed and well-nourished. Obese No distress.  HEENT: head atraumatic, normocephalic, pupils equal and reactive to light,  neck supple, throat within normal limits Cardiovascular: Normal rate, regular rhythm and normal heart sounds.  No murmur heard. No BLE edema. Pulmonary/Chest: Effort normal and breath sounds normal. No respiratory distress. Abdominal: Soft.  There is no tenderness. Psychiatric: Patient has a normal mood and affect. behavior is normal. Judgment and thought content normal.  Recent Results (from the past 2160 hour(s))  CBC with Differential/Platelet     Status: None   Collection Time: 08/11/17  8:51 AM  Result Value Ref Range   WBC 6.5 3.4 - 10.8 x10E3/uL   RBC 5.28 3.77 - 5.28 x10E6/uL   Hemoglobin 14.9 11.1 - 15.9 g/dL   Hematocrit 44.6 34.0 - 46.6 %   MCV 85 79 - 97 fL   MCH 28.2 26.6 - 33.0 pg   MCHC 33.4 31.5 - 35.7 g/dL   RDW 13.0 12.3 - 15.4 %   Platelets 199 150 - 450 x10E3/uL   Neutrophils 61 Not Estab. %   Lymphs 27 Not Estab. %   Monocytes 7 Not  Estab. %   Eos 4 Not Estab. %   Basos 1 Not Estab. %   Neutrophils Absolute 3.9 1.4 - 7.0 x10E3/uL   Lymphocytes Absolute 1.7 0.7 - 3.1 x10E3/uL   Monocytes Absolute 0.4 0.1 - 0.9 x10E3/uL   EOS (ABSOLUTE) 0.3 0.0 - 0.4 x10E3/uL   Basophils Absolute 0.1 0.0 - 0.2 x10E3/uL   Immature Granulocytes 0 Not Estab. %   Immature Grans (Abs) 0.0 0.0 - 0.1 x10E3/uL  Comprehensive metabolic panel     Status: Abnormal   Collection Time: 08/11/17  8:51 AM  Result Value Ref Range   Glucose 101 (H) 65 - 99 mg/dL   BUN 11 6 - 24 mg/dL   Creatinine, Ser 0.76 0.57 - 1.00 mg/dL   GFR calc non Af Amer 89 >59 mL/min/1.73   GFR calc Af Amer 103 >59 mL/min/1.73   BUN/Creatinine Ratio 14 9 - 23   Sodium 144 134 - 144 mmol/L   Potassium 4.0 3.5 - 5.2 mmol/L   Chloride 107 (H) 96 - 106 mmol/L   CO2 22 20 - 29 mmol/L   Calcium 9.7 8.7 - 10.2 mg/dL   Total Protein 6.5 6.0 - 8.5 g/dL   Albumin 4.3 3.5 - 5.5 g/dL   Globulin, Total 2.2 1.5 - 4.5 g/dL   Albumin/Globulin Ratio 2.0 1.2 - 2.2   Bilirubin Total 0.7 0.0 - 1.2 mg/dL   Alkaline Phosphatase 70 39 - 117 IU/L   AST 21 0 - 40 IU/L   ALT 34 (H) 0 - 32 IU/L  Lipid panel     Status: Abnormal   Collection Time: 08/11/17  8:51 AM  Result Value Ref Range   Cholesterol, Total 190 100 - 199 mg/dL   Triglycerides 96 0 - 149 mg/dL   HDL 45 >39 mg/dL   VLDL Cholesterol Cal 19 5 - 40 mg/dL   LDL Calculated 126 (H) 0 - 99 mg/dL   Chol/HDL Ratio 4.2 0.0 -  4.4 ratio    Comment:                                   T. Chol/HDL Ratio                                             Men  Women                               1/2 Avg.Risk  3.4    3.3                                   Avg.Risk  5.0    4.4                                2X Avg.Risk  9.6    7.1                                3X Avg.Risk 23.4   11.0   Measles/Mumps/Rubella Immunity     Status: None   Collection Time: 08/11/17  8:51 AM  Result Value Ref Range   Rubella Antibodies, IGG 10.50 Immune >0.99  index    Comment:                                 Non-immune       <0.90                                 Equivocal  0.90 - 0.99                                 Immune           >0.99    RUBEOLA AB, IGG >300.0 Immune >29.9 AU/mL    Comment:                                  Negative        <25.0                                  Equivocal 25.0 - 29.9                                  Positive        >29.9 Presence of antibodies to Rubeola is presumptive evidence of immunity except when acute infection is suspected.    MUMPS ABS, IGG 159.0 Immune >10.9 AU/mL    Comment:  Negative         <9.0                                 Equivocal  9.0 - 10.9                                 Positive        >10.9 A positive result generally indicates past exposure to Mumps virus or previous vaccination.   Hemoglobin A1c     Status: None   Collection Time: 08/11/17  8:51 AM  Result Value Ref Range   Hgb A1c MFr Bld 5.4 4.8 - 5.6 %    Comment:          Prediabetes: 5.7 - 6.4          Diabetes: >6.4          Glycemic control for adults with diabetes: <7.0    Est. average glucose Bld gHb Est-mCnc 108 mg/dL  TSH     Status: None   Collection Time: 08/11/17  8:51 AM  Result Value Ref Range   TSH 0.627 0.450 - 4.500 uIU/mL  VITAMIN D 25 Hydroxy (Vit-D Deficiency, Fractures)     Status: None   Collection Time: 08/11/17  8:51 AM  Result Value Ref Range   Vit D, 25-Hydroxy 30.6 30.0 - 100.0 ng/mL    Comment: Vitamin D deficiency has been defined by the [place] and an Endocrine Society practice guideline as a level of serum 25-OH vitamin D less than 20 ng/mL (1,2). The Endocrine Society went on to further define vitamin D insufficiency as a level between 21 and 29 ng/mL (2). 1. IOM (Institute of Medicine). 2010. Dietary reference    intakes for calcium and D. [place]: The    [employer]. 2. Holick MF, Binkley Winthrop, Bischoff-Ferrari HA, et  al.    Evaluation, treatment, and prevention of vitamin D    deficiency: an Endocrine Society clinical practice    guideline. JCEM. 2011 Jul; 96(7):1911-30.   Vitamin B12     Status: None   Collection Time: 08/11/17  8:51 AM  Result Value Ref Range   Vitamin B-12 968 232 - 1,245 pg/mL      PHQ2/9: Depression screen [facility] 2/9 10/14/2017 07/13/2017 05/09/2017 03/16/2017 11/15/2016  Decreased Interest 0 0 0 2 0  Down, Depressed, Hopeless 0 0 0 3 0  PHQ - 2 Score 0 0 0 5 0  Altered sleeping 0 1 3 0 -  Tired, decreased energy 0 0 1 1 -  Change in appetite 1 0 1 2 -  Feeling bad or failure about yourself  1 0 2 0 -  Trouble concentrating 0 0 0 2 -  Moving slowly or fidgety/restless 0 0 1 1 -  Suicidal thoughts 0 0 0 0 -  PHQ-9 Score 2 1 8 11  -  Difficult doing work/chores Not difficult at all Not difficult at all Somewhat difficult Somewhat difficult -     Fall Risk: Fall Risk  10/14/2017 07/13/2017 05/09/2017 03/16/2017 11/15/2016  Falls in the past year? No No No No No     Functional Status Survey: Is the patient deaf or have difficulty hearing?: No Does the patient have difficulty seeing, even when wearing glasses/contacts?: No Does the patient have difficulty concentrating, remembering, or making decisions?: No Does  the patient have difficulty walking or climbing stairs?: No Does the patient have difficulty dressing or bathing?: No Does the patient have difficulty doing errands alone such as visiting a doctor's office or shopping?: No    Assessment & Plan  1. Obesity (BMI 30-39.9)  Reviewed risk of medication, patient wants to continue taking it - Phentermine-Topiramate (QSYMIA) 7.5-46 MG CP24; Take 1 tablet by mouth daily.  Dispense: 90 capsule; Refill: 0  2. Need for immunization against influenza  - Flu Vaccine QUAD 6+ mos PF IM (Fluarix Quad PF)  3. Dyslipidemia  On lifestyle modification  4. Major depression in remission [facility])  Doing well at this time, off  medication   5. Angle-closure glaucoma, mild stage  Continue follow up with eye doctor

## 2018-01-25 ENCOUNTER — Encounter: Payer: Self-pay | Admitting: Family Medicine

## 2018-01-25 ENCOUNTER — Ambulatory Visit: Payer: Managed Care, Other (non HMO) | Admitting: Family Medicine

## 2018-01-25 VITALS — BP 148/96 | HR 93 | Temp 97.5°F | Resp 16 | Ht 62.0 in | Wt 203.2 lb

## 2018-01-25 DIAGNOSIS — F325 Major depressive disorder, single episode, in full remission: Secondary | ICD-10-CM

## 2018-01-25 DIAGNOSIS — E8881 Metabolic syndrome: Secondary | ICD-10-CM

## 2018-01-25 DIAGNOSIS — I10 Essential (primary) hypertension: Secondary | ICD-10-CM

## 2018-01-25 DIAGNOSIS — E785 Hyperlipidemia, unspecified: Secondary | ICD-10-CM

## 2018-01-25 DIAGNOSIS — R072 Precordial pain: Secondary | ICD-10-CM

## 2018-01-25 DIAGNOSIS — E669 Obesity, unspecified: Secondary | ICD-10-CM

## 2018-01-25 MED ORDER — SEMAGLUTIDE(0.25 OR 0.5MG/DOS) 2 MG/1.5ML ~~LOC~~ SOPN: 0.5000 mg | PEN_INJECTOR | SUBCUTANEOUS | 0 refills | Status: DC

## 2018-01-25 MED ORDER — HYDROCHLOROTHIAZIDE 12.5 MG PO TABS
12.5000 mg | ORAL_TABLET | Freq: Every day | ORAL | 0 refills | Status: DC
Start: 2018-01-25 — End: 2018-03-28

## 2018-01-25 NOTE — Progress Notes (Signed)
Name: Veronica Morton   MRN: [id number]    DOB: 23-Oct-1960   Date:01/25/2018       Progress Note  Subjective  Chief Complaint  Chief Complaint  Patient presents with  . Medication Refill  . Hypertension    Quit taking medication in November-due to the medication causing headaches-has had elevated BP.   Marland Kitchen Dyslipidemia  . Obesity  . Depression    HPI  Obesity: she started on Contrave on 03/19/2015 at 212 lbs, she stopped taking it October 2017 because she did not noticed any progression of weight loss. She started a program called [employer] [facility] [employer] with meal planning and life coach and she did well with weight loss and was able to come off HCTZ. She was doing well, lost 17 lbs initially and got down to 191 lbs, however gradually gaining weight again and we tried Belviq that did not work. She started on Qsymia 02/2017 and hadlost down again to 191 lbs, however having problems with insurance and once she stopped Qsymia because of cost, we resumed medication 09/2017 at weight of 202 , but her bp spiked and she stopped it again 11/2017 and weight has been stable since. She just joined [employer] 01/18/2018. We denies family history of thyroid cancer or personal history of pancreatitis and is willing to try Ozempic since she also has a history of metabolic syndrome   HTN:off HCTZ for months and bp was normal ,however she resumed Qsymia 09/2017 and noticed bp was staying up so she discontinued medication in Nov 2019, however bp has been high at home since. SBP 170's DBP 92-100, she is willing to resume medication. She states she has not been compliant with a low salt diet, but holidays has not be stressful for her. She noticed some left side chest pain described as aching feeling that can last hours. She denies any associated SOB, diaphoresis or nausea. Sometimes left arm pain, but states seems to be coming from her shoulder and not necessarily associated with chest pain   BP Readings from  Last 3 Encounters:  01/25/18 (!) 148/96  10/14/17 126/72  07/13/17 134/78   Dyslipidemia:reviewed last labs  The 10-year ASCVD risk score Mikey Bussing DC Jr., et al., 2013) is: 3%   Values used to calculate the score:     Age: 58 years     Sex: Female     Is Non-Hispanic African American: No     Diabetic: No     Tobacco smoker: No     Systolic Blood Pressure: 532 mmHg     Is BP treated: No     HDL Cholesterol: 45 mg/dL     Total Cholesterol: 190 mg/dL  Major Depression mild:she has stopped medication ( Lexapro )started Fall 2018, but stopped on her ownwithin 2 months because she did not noticed improvement of symptoms.lost her father 02/2017 and mother has Alzheimer's in 02/ 2019 , she never started zoloft, but has a new grandson now and is coping well, she wants to stay off medication, right now phq 9 is normal    Metabolic syndrome: based on increase in abdominal girth, elevated bp , and hyperglycemia.She denies polyphagia, polydipsia or polyuria   Patient Active Problem List   Diagnosis Date Noted  . 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 Left breast mass 03/18/2016  . Abnormal mammogram of left breast 08/05/2015  . History of skin cancer of unknown type 01/22/2015  . Dyslipidemia 08/29/2014  . H/O knee surgery 08/29/2014  . Menopause  08/29/2014  . History of anemia 08/29/2014  . Dysmetabolic syndrome 18/84/1660  . Decreased ferritin 08/29/2014  . Obesity (BMI 30-39.9) 08/29/2014  . 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 History of pneumonia 12/28/2012    Past Surgical History:  Procedure Laterality Date  . APPENDECTOMY    . BREAST BIOPSY Left 2018   COMPLETED IN [facility] - [facility]  . GLAUCOMA SURGERY Right 03-31-16  . HEAD & NECK SKIN LESION EXCISIONAL BIOPSY  01/22/2015   Dr. Grace Bushy at [facility]  . JOINT REPLACEMENT    . MASS EXCISION Left    ankle/ Dr Bary Castilla  . TOTAL KNEE ARTHROPLASTY  08/04/2011   Procedure: TOTAL KNEE  ARTHROPLASTY;  Surgeon: Ninetta Lights, MD;  Location: [place];  Service: Orthopedics;  Laterality: Right;  . TOTAL KNEE ARTHROPLASTY Left 03/07/2013   Procedure: LEFT TOTAL KNEE ARTHROPLASTY;  Surgeon: Ninetta Lights, MD;  Location: [place];  Service: Orthopedics;  Laterality: Left;  . TUBAL LIGATION      Family History  Problem Relation Age of Onset  . Rectal cancer Father 50  . Diabetes Father   . Diabetes Mother   . Breast cancer Mother 26  . Osteoporosis Mother   . Diabetes Sister   . Heart disease Sister   . Hypertension Brother   . Colon cancer Paternal Grandmother     Social History   Socioeconomic History  . Marital status: Married    Spouse name: Sharlet Salina   . Number of children: 3  . Years of education: Not on file  . Highest education level: Bachelor's degree (e.g., BA, AB, BS)  Occupational History  . Occupation: Financial planner   Social Needs  . Financial resource strain: Not hard at all  . Food insecurity:    Worry: Never true    Inability: Never true  . Transportation needs:    Medical: No    Non-medical: No  Tobacco Use  . Smoking status: Never Smoker  . Smokeless tobacco: Never Used  Substance and Sexual Activity  . Alcohol use: Yes    Alcohol/week: 0.0 standard drinks    Comment: occassionally-social  . Drug use: No  . Sexual activity: Not Currently  Lifestyle  . Physical activity:    Days per week: 4 days    Minutes per session: Not on file  . Stress: Not on file  Relationships  . Social connections:    Talks on phone: More than three times a week    Gets together: More than three times a week    Attends religious service: More than 4 times per year    Active member of club or organization: No    Attends meetings of clubs or organizations: Never    Relationship status: Married  . Intimate partner violence:    Fear of current or ex partner: No    Emotionally abused: No    Physically abused: No    Forced sexual activity: No  Other Topics  Concern  . Not on file  Social History Narrative   Married, husband is 87 years older, and had a hemorrhagic stroke on July 4th, 2018 and still has left side weakness   Father died 03-31-16   Works IT department at [employer] Medications:  .  Biotin 10000 MCG TABS, Take by mouth., Disp: , Rfl:  .  calcium-vitamin D 250-100 MG-UNIT tablet, Take 1 tablet by mouth 2 (two) times daily., Disp: , Rfl:  .  Carboxymethylcellul-Glycerin (REFRESH OPTIVE OP), Apply to eye., Disp: , Rfl:  .  cetirizine (ZYRTEC) 10 MG tablet, Take 10 mg by mouth as needed. , Disp: , Rfl:  .  dorzolamide (TRUSOPT) 2 % ophthalmic solution, 1 drop 3 (three) times daily., Disp: , Rfl:  .  Doxylamine Succinate, Sleep, (SLEEP AID PO), Take by mouth., Disp: , Rfl:  .  valACYclovir (VALTREX) 1000 MG tablet, Take 1 tablet (1,000 mg total) by mouth 2 (two) times daily. Takes 1 pill bid prn fever blisters (Patient not taking: Reported on 01/25/2018), Disp: 30 tablet, Rfl: 0 .  vitamin B-12 (CYANOCOBALAMIN) 1000 MCG tablet, Take 1,000 mcg by mouth daily., Disp: , Rfl:   No Known Allergies  I personally reviewed active problem list, medication list, allergies, family history, social history with the patient/caregiver today.   ROS  Constitutional: Negative for fever or weight change.  Respiratory: Negative for cough and shortness of breath.   Cardiovascular: Negative for chest pain or palpitations.  Gastrointestinal: Negative for abdominal pain, no bowel changes.  Musculoskeletal: Negative for gait problem or joint swelling.  Skin: Negative for rash.  Neurological: Negative for dizziness or headache.  No other specific complaints in a complete review of systems (except as listed in HPI above).  Objective  Vitals:   01/25/18 1327  BP: (!) 148/96  Pulse: 93  Resp: 16  Temp: (!) 97.5 F (36.4 C)  TempSrc: Oral  SpO2: 96%  Weight: 203 lb 3.2 oz (92.2 kg)  Height: 5\' 2"  (1.575 m)    Body mass  index is 37.17 kg/m.  Physical Exam  Constitutional: Patient appears well-developed and well-nourished. Obese  No distress.  HEENT: head atraumatic, normocephalic, pupils equal and reactive to light,neck supple, throat within normal limits Cardiovascular: Normal rate, regular rhythm and normal heart sounds.  No murmur heard. No BLE edema. Pulmonary/Chest: Effort normal and breath sounds normal. No respiratory distress. Abdominal: Soft.  There is no tenderness. Psychiatric: Patient has a normal mood and affect. behavior is normal. Judgment and thought content normal.  PHQ2/9: Depression screen [facility] 2/9 01/25/2018 10/14/2017 07/13/2017 05/09/2017 03/16/2017  Decreased Interest 0 0 0 0 2  Down, Depressed, Hopeless 0 0 0 0 3  PHQ - 2 Score 0 0 0 0 5  Altered sleeping 0 0 1 3 0  Tired, decreased energy 0 0 0 1 1  Change in appetite 0 1 0 1 2  Feeling bad or failure about yourself  0 1 0 2 0  Trouble concentrating 0 0 0 0 2  Moving slowly or fidgety/restless 0 0 0 1 1  Suicidal thoughts 0 0 0 0 0  PHQ-9 Score 0 2 1 8 11   Difficult doing work/chores Not difficult at all Not difficult at all Not difficult at all Somewhat difficult Somewhat difficult     Fall Risk: Fall Risk  01/25/2018 10/14/2017 07/13/2017 05/09/2017 03/16/2017  Falls in the past year? 0 No No No No  Number falls in past yr: 0 - - - -  Injury with Fall? 0 - - - -     Functional Status Survey: Is the patient deaf or have difficulty hearing?: No Does the patient have difficulty seeing, even when wearing glasses/contacts?: No Does the patient have difficulty concentrating, remembering, or making decisions?: No Does the patient have difficulty walking or climbing stairs?: No Does the patient have difficulty dressing or bathing?: No Does the patient have difficulty doing errands alone such as visiting a doctor's office or shopping?: No  Assessment & Plan  1. Hypertension, benign  - hydrochlorothiazide (HYDRODIURIL) 12.5  MG tablet; Take 1 tablet (12.5 mg total) by mouth daily.  Dispense: 90 tablet; Refill: 0  2. Dyslipidemia  We will monitor for now  3. Major depression in remission ([place])  In remission, and doing well   4. Morbid obesity ([place])  BMI above 35 with co-morbidities, failed multiple medications  and also cannot take some of them because of HTN, we will try Ozempic   5. Abdominal obesity and metabolic syndrome  - RSWNIOEVOJJ,0.09 or 0.5MG /DOS, (OZEMPIC, 0.25 OR 0.5 MG/DOSE,) 2 MG/1.5ML SOPN; Inject 0.5 mg into the skin once a week.  Dispense: 2 pen; Refill: 0  6. Precordial pain  - Ambulatory referral to Cardiology

## 2018-01-27 ENCOUNTER — Other Ambulatory Visit: Payer: Self-pay

## 2018-01-27 DIAGNOSIS — E8881 Metabolic syndrome: Secondary | ICD-10-CM

## 2018-01-27 DIAGNOSIS — E669 Obesity, unspecified: Secondary | ICD-10-CM

## 2018-01-27 MED ORDER — SEMAGLUTIDE(0.25 OR 0.5MG/DOS) 2 MG/1.5ML ~~LOC~~ SOPN: 0.5000 mg | PEN_INJECTOR | SUBCUTANEOUS | 0 refills | Status: DC

## 2018-01-27 NOTE — Telephone Encounter (Signed)
Insurance prefers medication Ozempic to be 3 ml for 30 days or a 90 ml for 90 days instead of writing the prescription for 2 pens. Please change and send into the pharmacy.

## 2018-01-31 ENCOUNTER — Telehealth: Payer: Self-pay

## 2018-01-31 NOTE — Telephone Encounter (Signed)
Prior Authorization was initiated for Ozempic and denied. Information provided does not show that she meets the criteria listed below.  1. Two articles from major peer viewed medical journals.  2. [facility] Formulary Service Drug Information.

## 2018-01-31 NOTE — Telephone Encounter (Signed)
Patient called.  Patient aware.  

## 2018-01-31 NOTE — Telephone Encounter (Signed)
Please notify patient.

## 2018-02-01 MED ORDER — METFORMIN HCL ER 500 MG PO TB24: 500.0000 mg | ORAL_TABLET | Freq: Every day | ORAL | 0 refills | Status: DC

## 2018-02-01 NOTE — Telephone Encounter (Signed)
I will send rx of Metformin as required

## 2018-02-01 NOTE — Telephone Encounter (Signed)
Prior authorization update from [employer]. Ozempic is covered if she meet the following: She has tried or cannot use on of the following:  Metformin Metformin ER Glipizide-Metformin Glyburide-Metformin Pioglitazaone-Metformin

## 2018-02-14 ENCOUNTER — Encounter: Payer: Self-pay | Admitting: Cardiology

## 2018-02-14 ENCOUNTER — Ambulatory Visit: Payer: Managed Care, Other (non HMO) | Admitting: Cardiology

## 2018-02-14 VITALS — BP 140/78 | HR 91 | Ht 62.0 in | Wt 208.8 lb

## 2018-02-14 DIAGNOSIS — E7800 Pure hypercholesterolemia, unspecified: Secondary | ICD-10-CM | POA: Diagnosis not present

## 2018-02-14 DIAGNOSIS — Z6838 Body mass index (BMI) 38.0-38.9, adult: Secondary | ICD-10-CM

## 2018-02-14 DIAGNOSIS — R079 Chest pain, unspecified: Secondary | ICD-10-CM

## 2018-02-14 DIAGNOSIS — Z01812 Encounter for preprocedural laboratory examination: Secondary | ICD-10-CM

## 2018-02-14 DIAGNOSIS — I10 Essential (primary) hypertension: Secondary | ICD-10-CM

## 2018-02-14 DIAGNOSIS — Z7189 Other specified counseling: Secondary | ICD-10-CM

## 2018-02-14 MED ORDER — METOPROLOL TARTRATE 50 MG PO TABS: ORAL_TABLET | ORAL | 0 refills | Status: DC

## 2018-02-14 NOTE — Progress Notes (Signed)
Cardiology Office Note:    Date:  02/14/2018   ID:  MALLOREY ODONELL, DOB August 06, 1960, MRN [id number]  PCP:  Steele Sizer, MD  Cardiologist:  Buford Dresser, MD PhD  Referring MD: Steele Sizer, MD   CC: new consult, left arm/chest discomfort  History of Present Illness:    Veronica Morton is a 58 y.o. female with a hx of hypertension, dyslipidemia, obesity who is seen as a new consult at the request of Steele Sizer, MD for the evaluation and management of left arm pain/chest pain episode.  Precordial pain: -Initial onset: around Christmas, first time ever. Was being very active/busy getting ready. Blood pressure was running 150-160/90-110. Noted onset of arm pain and chest heaviness -Quality: heaviness in chest, numbness in left arm. No radiation.  -Frequency: 2-3 times over the holdidays -Duration: more than several hours -Associated symptoms: at the time of the pain, no other symptoms. Does feel lightheaded occasionally but not at the time.  -Aggravating/alleviating factors: Nothing clearly worse. Rested for several hours, felt better. -Prior cardiac history: none -Prior ECG: 2018 NSR, left axis -Prior workup: none -Prior treatment: none -Alcohol: rarely -Tobacco: never -Comorbidities: obesity, hypertension, metabolic syndrome -Exercise level: sedentary job, but walks regularly and goes to the gym on her lunch break sometimes. Never has exertional chest pain.  -Cardiac ROS: no shortness of breath, no PND, no orthopnea, no LE edema, no syncope -Family history: mother has had a stroke, maternal Gma had strokes. No history of MI. Mother has diabetes, afib, hypertension, hyperlipidemia  Past Medical History:  Diagnosis Date  . Allergy   . Anemia   . Arthritis   . Basal cell carcinoma of left lower eyelid 12/20/2014   [facility]: Dr. Otila Back  . Cancer ([place]) 2017   skin  . Dyslipidemia   . Glaucoma   . Herpes simplex disease     . Hypertension   . Low ferritin   . Murmur   . Obesity   . Peri-menopause   . Tremor     Past Surgical History:  Procedure Laterality Date  . APPENDECTOMY    . BREAST BIOPSY Left 2018   COMPLETED IN [facility] - [facility]G  . GLAUCOMA SURGERY Right 02/2016  . HEAD & NECK SKIN LESION EXCISIONAL BIOPSY  01/22/2015   Dr. Grace Bushy at [facility]  . JOINT REPLACEMENT    . MASS EXCISION Left    ankle/ Dr Bary Castilla  . TOTAL KNEE ARTHROPLASTY  08/04/2011   Procedure: TOTAL KNEE ARTHROPLASTY;  Surgeon: Ninetta Lights, MD;  Location: [facility];  Service: Orthopedics;  Laterality: Right;  . TOTAL KNEE ARTHROPLASTY Left 03/07/2013   Procedure: LEFT TOTAL KNEE ARTHROPLASTY;  Surgeon: Ninetta Lights, MD;  Location: [facility];  Service: Orthopedics;  Laterality: Left;  . TUBAL LIGATION      Current Medications: Current Outpatient Medications on File Prior to Visit  Medication Sig  . Biotin 10000 MCG TABS Take by mouth.  . calcium-vitamin D 250-100 MG-UNIT tablet Take 1 tablet by mouth 2 (two) times daily.  . Carboxymethylcellul-Glycerin (REFRESH OPTIVE OP) Apply to eye.  . cetirizine (ZYRTEC) 10 MG tablet Take 10 mg by mouth as needed.   . dorzolamide (TRUSOPT) 2 % ophthalmic solution 1 drop 3 (three) times daily.  . Doxylamine Succinate, Sleep, (SLEEP AID PO) Take by mouth.  . hydrochlorothiazide (HYDRODIURIL) 12.5 MG tablet Take 1 tablet (12.5 mg total) by mouth daily.  . valACYclovir (VALTREX) 1000 MG tablet Take  1 tablet (1,000 mg total) by mouth 2 (two) times daily. Takes 1 pill bid prn fever blisters (Patient taking differently: Take 1,000 mg by mouth as needed. Takes 1 pill bid prn fever blisters)  . vitamin B-12 (CYANOCOBALAMIN) 1000 MCG tablet Take 1,000 mcg by mouth daily.  . metFORMIN (GLUCOPHAGE-XR) 500 MG 24 hr tablet Take 1 tablet (500 mg total) by mouth daily with breakfast. (Patient not taking: Reported on 02/14/2018)   No current facility-administered medications on file  prior to visit.      Allergies:   Patient has no known allergies.   Social History   Socioeconomic History  . Marital status: Married    Spouse name: Sharlet Salina   . Number of children: 3  . Years of education: Not on file  . Highest education level: Bachelor's degree (e.g., BA, AB, BS)  Occupational History  . Occupation: Financial planner   Social Needs  . Financial resource strain: Not hard at all  . Food insecurity:    Worry: Never true    Inability: Never true  . Transportation needs:    Medical: No    Non-medical: No  Tobacco Use  . Smoking status: Never Smoker  . Smokeless tobacco: Never Used  Substance and Sexual Activity  . Alcohol use: Yes    Alcohol/week: 0.0 standard drinks    Comment: occassionally-social  . Drug use: No  . Sexual activity: Not Currently  Lifestyle  . Physical activity:    Days per week: 4 days    Minutes per session: Not on file  . Stress: Not on file  Relationships  . Social connections:    Talks on phone: More than three times a week    Gets together: More than three times a week    Attends religious service: More than 4 times per year    Active member of club or organization: No    Attends meetings of clubs or organizations: Never    Relationship status: Married  Other Topics Concern  . Not on file  Social History Narrative   Married, husband is 34 years older, and had a hemorrhagic stroke on July 4th, 2018 and still has left side weakness   Father died Mar 30, 2016   Works IT department at [employer] History: The patient's family history includes Breast cancer (age of onset: 30) in her mother; Colon cancer in her paternal grandmother; Diabetes in her father, mother, and sister; Heart disease in her sister; Hypertension in her brother; Osteoporosis in her mother; Rectal cancer (age of onset: 86) in her father. mother has had a stroke, maternal Gma had strokes. No history of MI. Mother has diabetes, afib, hypertension,  hyperlipidemia  ROS:   Please see the history of present illness.  Additional pertinent ROS:  Constitutional: Negative for chills, fever, night sweats, unintentional weight loss  HENT: Negative for ear pain and hearing loss.   Eyes: Negative for loss of vision and eye pain. Has narrow angle glaucoma Respiratory: Negative for cough, sputum, shortness of breath, wheezing.   Cardiovascular: Positive for chest discomfort. Negative for  (stopped once stopped taking diet pills), PND, orthopnea, lower extremity edema and claudication.  Gastrointestinal: Negative for abdominal pain, melena, and hematochezia.  Genitourinary: Negative for dysuria and hematuria.  Musculoskeletal: Negative for falls and myalgias.  Skin: Negative for itching and rash.  Neurological: Negative for focal weakness, focal sensory changes and loss of consciousness.  Endo/Heme/Allergies: Does not bruise/bleed easily.   EKGs/Labs/Other Studies Reviewed:  The following studies were reviewed today: No prior cardiac studies  EKG:  EKG is personally reviewed.  The ekg ordered today demonstrates normal sinus rhythm, left axis deviation, poor R wave progression  Recent Labs: 08/11/2017: ALT 34; BUN 11; Creatinine, Ser 0.76; Hemoglobin 14.9; Platelets 199; Potassium 4.0; Sodium 144; TSH 0.627  Recent Lipid Panel    Component Value Date/Time   CHOL 190 08/11/2017 0851   TRIG 96 08/11/2017 0851   HDL 45 08/11/2017 0851   CHOLHDL 4.2 08/11/2017 0851   LDLCALC 126 (H) 08/11/2017 0851    Physical Exam:    VS:  BP 140/78 (BP Location: Right Arm, Patient Position: Sitting, Cuff Size: Normal)   Pulse 91   Ht 5\' 2"  (1.575 m)   Wt 208 lb 12.8 oz (94.7 kg)   LMP 05/12/2014 (Exact Date)   BMI 38.19 kg/m     Wt Readings from Last 3 Encounters:  02/14/18 208 lb 12.8 oz (94.7 kg)  01/25/18 203 lb 3.2 oz (92.2 kg)  10/14/17 202 lb 1.6 oz (91.7 kg)     GEN: Well nourished, well developed in no acute distress HEENT:  Normal NECK: No JVD; No carotid bruits LYMPHATICS: No lymphadenopathy CARDIAC: regular rhythm, normal S1 and S2, no murmurs, rubs, gallops. Radial and DP pulses 2+ bilaterally. RESPIRATORY:  Clear to auscultation without rales, wheezing or rhonchi  ABDOMEN: Soft, non-tender, non-distended MUSCULOSKELETAL:  No edema; No deformity  SKIN: Warm and dry NEUROLOGIC:  Alert and oriented x 3 PSYCHIATRIC:  Normal affect   ASSESSMENT:    No diagnosis found. PLAN:    1. Chest pain: sporadic, does have risk factors of hypertension, hyperlipidemia, obesity, family history. Discussed options for testing, including exercise treadmill and CT coronary. She wishes to pursue CT coronary -coronary CT, BMET prior, metoprolol dose prior to test  2. Hypertension: borderline today, would like <140/90 and ideally 130/80.   -continue to monitor, work on lifestyle (below)  -continue HCTZ  -if abnormalities seen on CT coronary, would discuss ACEi or carvedilol as next antihypertensive  3. Primary prevention -recommend heart healthy/Mediterranean diet, with whole grains, fruits, vegetable, fish, lean meats, nuts, and olive oil. Limit salt. -recommend moderate walking, 3-5 times/week for 30-50 minutes each session. Aim for at least 150 minutes.week. Goal should be pace of 3 miles/hours, or walking 1.5 miles in 30 minutes -recommend avoidance of tobacco products. Avoid excess alcohol. -Additional risk factor control:  -Diabetes: A1c is 5.4  -Lipids: hypercholesterolemia, with [id number]. With risk factors, would like closer to 100. If CAD seen on coronary CTA, will need LDL <70 and statin  -Blood pressure control: as above  -Weight: BMI 38, class 2 obesity. If CT coronary ok, would increase activity level -ASCVD risk score:  The 10-year ASCVD risk score Mikey Bussing DC Brooke Bonito., et al., 2013) is: 3.7%   Values used to calculate the score:     Age: 18 years     Sex: Female     Is Non-Hispanic African American: No      Diabetic: No     Tobacco smoker: No     Systolic Blood Pressure: 045 mmHg     Is BP treated: Yes     HDL Cholesterol: 45 mg/dL     Total Cholesterol: 190 mg/dL   Plan for follow up: 2 mos to discuss results of test and next steps  Medication Adjustments/Labs and Tests Ordered: Current medicines are reviewed at length with the patient today.  Concerns regarding medicines are outlined above.  Orders Placed This Encounter  Procedures  . CT CORONARY MORPH W/CTA COR W/SCORE W/CA W/CM &/OR WO/CM  . CT CORONARY FRACTIONAL FLOW RESERVE DATA PREP  . CT CORONARY FRACTIONAL FLOW RESERVE FLUID ANALYSIS  . Basic metabolic panel  . EKG 12-Lead   Meds ordered this encounter  Medications  . metoprolol tartrate (LOPRESSOR) 50 MG tablet    Sig: TAKE 1 TABLET 2 HR PRIOR TO CARDIAC PROCEDURE    Dispense:  1 tablet    Refill:  0    Patient Instructions  Medication Instructions:  Your Physician recommend you continue on your current medication as directed.    If you need a refill on your cardiac medications before your next appointment, please call your pharmacy.   Lab work: Your physician recommends that you return for lab work 1 week prior to procedure.  If you have labs (blood work) drawn today and your tests are completely normal, you will receive your results only by: Marland Kitchen MyChart Message (if you have MyChart) OR . A paper copy in the mail If you have any lab test that is abnormal or we need to change your treatment, we will call you to review the results.  Testing/Procedures: Your physician has requested that you have cardiac CT. Cardiac computed tomography (CT) is a painless test that uses an x-ray machine to take clear, detailed pictures of your heart. For further information please visit HugeFiesta.tn. Please follow instruction sheet as given. [facility]    Follow-Up: At [facility], you and your health needs are our priority.  As part of our continuing mission to  provide you with exceptional heart care, we have created designated Provider Care Teams.  These Care Teams include your primary Cardiologist (physician) and Advanced Practice Providers (APPs -  Physician Assistants and Nurse Practitioners) who all work together to provide you with the care you need, when you need it. You will need a follow up appointment in 2 months.  Please call our office 2 months in advance to schedule this appointment.  You may see Dr. Harrell Gave or one of the following Advanced Practice Providers on your designated Care Team:   Rosaria Ferries, PA-C . Jory Sims, DNP, ANP  Please arrive at the [facility] main entrance of [facility] at xx:xx AM (30-45 minutes prior to test start time)  [facility] [place], [place] [address] [phone]  Proceed to the [facility] Radiology Department (First Floor).  Please follow these instructions carefully (unless otherwise directed):  On the Night Before the Test: . Be sure to Drink plenty of water. . Do not consume any caffeinated/decaffeinated beverages or chocolate 12 hours prior to your test. . Do not take any antihistamines 12 hours prior to your test.   On the Day of the Test: . Drink plenty of water. Do not drink any water within one hour of the test. . Do not eat any food 4 hours prior to the test. . You may take your regular medications prior to the test.  . Take metoprolol (Lopressor) two hours prior to test. . HOLD Hydrochlorothiazide morning of the test.       After the Test: . Drink plenty of water. . After receiving IV contrast, you may experience a mild flushed feeling. This is normal. . On occasion, you may experience a mild rash up to 24 hours after the test. This is not dangerous. If this occurs, you can take Benadryl 25 mg and increase  your fluid intake. . If you experience trouble breathing, this can be serious. If it is severe call 911 IMMEDIATELY. If it is  mild, please call our office.        Signed, Buford Dresser, MD PhD 02/14/2018 8:24 AM    [place]

## 2018-02-14 NOTE — Patient Instructions (Signed)
Medication Instructions:  Your Physician recommend you continue on your current medication as directed.    If you need a refill on your cardiac medications before your next appointment, please call your pharmacy.   Lab work: Your physician recommends that you return for lab work 1 week prior to procedure.  If you have labs (blood work) drawn today and your tests are completely normal, you will receive your results only by: Marland Kitchen MyChart Message (if you have MyChart) OR . A paper copy in the mail If you have any lab test that is abnormal or we need to change your treatment, we will call you to review the results.  Testing/Procedures: Your physician has requested that you have cardiac CT. Cardiac computed tomography (CT) is a painless test that uses an x-ray machine to take clear, detailed pictures of your heart. For further information please visit HugeFiesta.tn. Please follow instruction sheet as given. [facility]    Follow-Up: At [facility], you and your health needs are our priority.  As part of our continuing mission to provide you with exceptional heart care, we have created designated Provider Care Teams.  These Care Teams include your primary Cardiologist (physician) and Advanced Practice Providers (APPs -  Physician Assistants and Nurse Practitioners) who all work together to provide you with the care you need, when you need it. You will need a follow up appointment in 2 months.  Please call our office 2 months in advance to schedule this appointment.  You may see Dr. Harrell Gave or one of the following Advanced Practice Providers on your designated Care Team:   Rosaria Ferries, PA-C . Jory Sims, DNP, ANP  Please arrive at the [facility] main entrance of [facility] at xx:xx AM (30-45 minutes prior to test start time)  [address] [place], [place] [address] [phone]  Proceed to the [facility] Radiology Department  (First Floor).  Please follow these instructions carefully (unless otherwise directed):  On the Night Before the Test: . Be sure to Drink plenty of water. . Do not consume any caffeinated/decaffeinated beverages or chocolate 12 hours prior to your test. . Do not take any antihistamines 12 hours prior to your test.   On the Day of the Test: . Drink plenty of water. Do not drink any water within one hour of the test. . Do not eat any food 4 hours prior to the test. . You may take your regular medications prior to the test.  . Take metoprolol (Lopressor) two hours prior to test. . HOLD Hydrochlorothiazide morning of the test.       After the Test: . Drink plenty of water. . After receiving IV contrast, you may experience a mild flushed feeling. This is normal. . On occasion, you may experience a mild rash up to 24 hours after the test. This is not dangerous. If this occurs, you can take Benadryl 25 mg and increase your fluid intake. . If you experience trouble breathing, this can be serious. If it is severe call 911 IMMEDIATELY. If it is mild, please call our office.

## 2018-02-20 ENCOUNTER — Encounter: Payer: Self-pay | Admitting: Cardiology

## 2018-02-20 DIAGNOSIS — Z6838 Body mass index (BMI) 38.0-38.9, adult: Secondary | ICD-10-CM

## 2018-02-20 DIAGNOSIS — I10 Essential (primary) hypertension: Secondary | ICD-10-CM | POA: Insufficient documentation

## 2018-02-20 DIAGNOSIS — E7800 Pure hypercholesterolemia, unspecified: Secondary | ICD-10-CM | POA: Insufficient documentation

## 2018-03-01 ENCOUNTER — Ambulatory Visit: Payer: Managed Care, Other (non HMO) | Admitting: Family Medicine

## 2018-03-15 ENCOUNTER — Other Ambulatory Visit: Payer: Self-pay | Admitting: Family Medicine

## 2018-03-15 DIAGNOSIS — B001 Herpesviral vesicular dermatitis: Secondary | ICD-10-CM

## 2018-03-15 MED ORDER — VALACYCLOVIR HCL 1 G PO TABS: 1000.0000 mg | ORAL_TABLET | Freq: Two times a day (BID) | ORAL | 2 refills | Status: AC

## 2018-03-15 NOTE — Telephone Encounter (Unsigned)
Copied from [place] [id number]. Topic: Quick Communication - Rx Refill/Question >> Mar 15, 2018  9:24 AM Antonieta Iba C wrote: Medication: valACYclovir (VALTREX) 1000 MG tablet  Has the patient contacted their pharmacy? No - (Agent: If no, request that the patient contact the pharmacy for the refill.) (Agent: If yes, when and what did the pharmacy advise?)  Preferred Pharmacy (with phone number or street name): [facility], [place] [phone] (Phone) [phone] (Fax)    Agent: Please be advised that RX refills may take up to 3 business days. We ask that you follow-up with your pharmacy.

## 2018-03-28 ENCOUNTER — Other Ambulatory Visit: Payer: Self-pay | Admitting: Family Medicine

## 2018-03-28 DIAGNOSIS — I10 Essential (primary) hypertension: Secondary | ICD-10-CM

## 2018-04-03 ENCOUNTER — Telehealth (HOSPITAL_COMMUNITY): Payer: Self-pay | Admitting: Emergency Medicine

## 2018-04-03 NOTE — Telephone Encounter (Signed)
Left message on voicemail with name and callback number Caidance Sybert RN Navigator Cardiac Imaging [facility] [phone] Office [phone] Cell  

## 2018-04-04 ENCOUNTER — Telehealth (HOSPITAL_COMMUNITY): Payer: Self-pay | Admitting: Emergency Medicine

## 2018-04-04 NOTE — Telephone Encounter (Signed)
Reaching out to patient to offer assistance regarding upcoming cardiac imaging study; pt verbalizes understanding of appt date/time, parking situation and where to check in, pre-test NPO status and medications ordered, and verified current allergies; name and call back number provided for further questions should they arise Veronica Bond RN [facility] [phone] office [phone] cell  Pt did not know she needed labs drawn, nor did she know she needed to pick up metoprolol from her pharmacy. Patient states she will work on these today.

## 2018-04-05 ENCOUNTER — Ambulatory Visit (HOSPITAL_COMMUNITY): Admission: RE | Admit: 2018-04-05 | Payer: Managed Care, Other (non HMO) | Source: Ambulatory Visit

## 2018-04-05 ENCOUNTER — Encounter (HOSPITAL_COMMUNITY): Payer: Self-pay

## 2018-04-05 ENCOUNTER — Other Ambulatory Visit: Payer: Self-pay

## 2018-04-05 ENCOUNTER — Ambulatory Visit (HOSPITAL_COMMUNITY)
Admission: RE | Admit: 2018-04-05 | Discharge: 2018-04-05 | Disposition: A | Discharge status: Home / Self Care | Payer: Managed Care, Other (non HMO) | Source: Ambulatory Visit | Attending: Cardiology | Admitting: Cardiology

## 2018-04-05 DIAGNOSIS — R079 Chest pain, unspecified: Secondary | ICD-10-CM

## 2018-04-05 LAB — BASIC METABOLIC PANEL
BUN/Creatinine Ratio: 23 (ref 9–23)
BUN: 16 mg/dL (ref 6–24)
CO2: 23 mmol/L (ref 20–29)
CREATININE: 0.71 mg/dL (ref 0.57–1.00)
Calcium: 10.4 mg/dL — ABNORMAL HIGH (ref 8.7–10.2)
Chloride: 103 mmol/L (ref 96–106)
GFR calc Af Amer: 111 mL/min/{1.73_m2} (ref >59)
GFR, EST NON AFRICAN AMERICAN: 96 mL/min/{1.73_m2} (ref >59)
Glucose: 79 mg/dL (ref 65–99)
Potassium: 3.9 mmol/L (ref 3.5–5.2)
Sodium: 140 mmol/L (ref 134–144)

## 2018-04-05 MED ORDER — NITROGLYCERIN 0.4 MG SL SUBL
0.8000 mg | SUBLINGUAL_TABLET | Freq: Once | SUBLINGUAL | Status: AC
  Administered 2018-04-05: 0.8 mg via SUBLINGUAL
  Filled 2018-04-05: qty 25

## 2018-04-05 MED ORDER — IOHEXOL 350 MG/ML SOLN
80.0000 mL | Freq: Once | INTRAVENOUS | Status: AC | PRN
  Administered 2018-04-05: 80 mL via INTRAVENOUS

## 2018-04-05 MED ORDER — NITROGLYCERIN 0.4 MG SL SUBL
SUBLINGUAL_TABLET | SUBLINGUAL | Status: AC
  Filled 2018-04-05: qty 2

## 2018-04-05 NOTE — Progress Notes (Signed)
Pt tolerated cardiac CTA without incident.  Vitals as noted in chart.  PIV removed and dressing applied.  Discharge instructions (written and verbal) given to patient.  Pt discharged

## 2018-04-05 NOTE — Discharge Instructions (Signed)
Cardiac CT Angiogram ° °A cardiac CT angiogram is a procedure to look at the heart and the area around the heart. It may be done to help find the cause of chest pains or other symptoms of heart disease. During this procedure, a large X-ray machine, called a CT scanner, takes detailed pictures of the heart and the surrounding area after a dye (contrast material) has been injected into blood vessels in the area. The procedure is also sometimes called a coronary CT angiogram, coronary artery scanning, or CTA. °A cardiac CT angiogram allows the health care provider to see how well blood is flowing to and from the heart. The health care provider will be able to see if there are any problems, such as: °· Blockage or narrowing of the coronary arteries in the heart. °· Fluid around the heart. °· Signs of weakness or disease in the muscles, valves, and tissues of the heart. °Tell a health care provider about: °· Any allergies you have. This is especially important if you have had a previous allergic reaction to contrast dye. °· All medicines you are taking, including vitamins, herbs, eye drops, creams, and over-the-counter medicines. °· Any blood disorders you have. °· Any surgeries you have had. °· Any medical conditions you have. °· Whether you are pregnant or may be pregnant. °· Any anxiety disorders, chronic pain, or other conditions you have that may increase your stress or prevent you from lying still. °What are the risks? °Generally, this is a safe procedure. However, problems may occur, including: °· Bleeding. °· Infection. °· Allergic reactions to medicines or dyes. °· Damage to other structures or organs. °· Kidney damage from the dye or contrast that is used. °· Increased risk of cancer from radiation exposure. This risk is low. Talk with your health care provider about: °? The risks and benefits of testing. °? How you can receive the lowest dose of radiation. °What happens before the procedure? °· Wear  comfortable clothing and remove any jewelry, glasses, dentures, and hearing aids. °· Follow instructions from your health care provider about eating and drinking. This may include: °? For 12 hours before the test -- avoid caffeine. This includes tea, coffee, soda, energy drinks, and diet pills. Drink plenty of water or other fluids that do not have caffeine in them. Being well-hydrated can prevent complications. °? For 4-6 hours before the test -- stop eating and drinking. The contrast dye can cause nausea, but this is less likely if your stomach is empty. °· Ask your health care provider about changing or stopping your regular medicines. This is especially important if you are taking diabetes medicines, blood thinners, or medicines to treat erectile dysfunction. °What happens during the procedure? °· Hair on your chest may need to be removed so that small sticky patches called electrodes can be placed on your chest. These will transmit information that helps to monitor your heart during the test. °· An IV tube will be inserted into one of your veins. °· You might be given a medicine to control your heart rate during the test. This will help to ensure that good images are obtained. °· You will be asked to lie on an exam table. This table will slide in and out of the CT machine during the procedure. °· Contrast dye will be injected into the IV tube. You might feel warm, or you may get a metallic taste in your mouth. °· You will be given a medicine (nitroglycerin) to relax (dilate) the arteries   in your heart. °· The table that you are lying on will move into the CT machine tunnel for the scan. °· The person running the machine will give you instructions while the scans are being done. You may be asked to: °? Keep your arms above your head. °? Hold your breath. °? Stay very still, even if the table is moving. °· When the scanning is complete, you will be moved out of the machine. °· The IV tube will be removed. °The  procedure may vary among health care providers and hospitals. °What happens after the procedure? °· You might feel warm, or you may get a metallic taste in your mouth from the contrast dye. °· You may have a headache from the nitroglycerin. °· After the procedure, drink water or other fluids to wash (flush) the contrast material out of your body. °· Contact a health care provider if you have any symptoms of allergy to the contrast. These symptoms include: °? Shortness of breath. °? Rash or hives. °? A racing heartbeat. °· Most people can return to their normal activities right after the procedure. Ask your health care provider what activities are safe for you. °· It is up to you to get the results of your procedure. Ask your health care provider, or the department that is doing the procedure, when your results will be ready. °Summary °· A cardiac CT angiogram is a procedure to look at the heart and the area around the heart. It may be done to help find the cause of chest pains or other symptoms of heart disease. °· During this procedure, a large X-ray machine, called a CT scanner, takes detailed pictures of the heart and the surrounding area after a dye (contrast material) has been injected into blood vessels in the area. °· Ask your health care provider about changing or stopping your regular medicines before the procedure. This is especially important if you are taking diabetes medicines, blood thinners, or medicines to treat erectile dysfunction. °· After the procedure, drink water or other fluids to wash (flush) the contrast material out of your body. °This information is not intended to replace advice given to you by your health care provider. Make sure you discuss any questions you have with your health care provider. °Document Released: 12/18/2007 Document Revised: 11/24/2015 Document Reviewed: 11/24/2015 °Elsevier Interactive Patient Education © 2019 [employer] ° ° °What You Need to Know About IV Contrast  Material °IV contrast material is most often a fluid that is used with some imaging tests. Contrast material is injected into your body through a vein to help your health care providers see your organs and tissues more clearly. It may be used with: °· X-ray. °· MRI. °· CT. °· Ultrasound. °Contrast material is used when your health care providers need a detailed look at organs, tissues, or blood vessels that may not show up with the standard test. IV contrast may be used for imaging tests that examine: °· Muscles, skin, and fat. °· Breasts. °· Brain. °· Digestive tract. °· Heart. °· Liver. °· Lungs and many other internal organs. °What are the risks of using IV contrast material? °The risks of using IV contrast material include: °· Headache. °· Itching, skin rash, and hives. °· Allergic reactions. °· Nausea and vomiting. °· Wheezing or difficulty breathing. °· Abnormal heart rate. °· Blood pressure changes. °· Throat swelling. °· Kidney damage. °These complications are more likely to occur in people who: °· Have kidney failure. °· Have liver problems. °·   Have certain heart problems, including: °? Heart failure. °? Heart attack. °? Heart infection. °? Heart valve problems. °· Abuse alcohol. °· Have allergies or asthma. °· Are dehydrated. °· Have sickle cell anemia or similar problems. °· Have had trouble with IV contrast material in the past. °· Take certain medicines, such as: °? Metformin. °? NSAIDs. °? Beta blockers. °? Interleukin-2. °How do I prepare for my test with IV contrast material? °· Follow instructions from your health care provider about eating or drinking restrictions. °· Ask your health care provider about changing or stopping your regular medicines. This is especially important if you are taking diabetes medicines or blood thinners. °· Tell your health care provider about: °? Any previous illnesses, surgeries, or pre-existing medical conditions. °? Whether you are pregnant or may be  pregnant. °? Whether you are breastfeeding. Most contrast agents are safe for use in breastfeeding women. °· You may have a physical exam to determine any potential risks. °· Ask if you will be given a medicine (sedative) to help you relax during the procedure. If so, plan to have someone take you home after test. °What happens during the test with IV contrast material? ° °· You may be given a sedative to help you relax. °· A needle will be inserted into one of your veins to administer the IV contrast material. °· You may feel warmth or flushing as the material enters your bloodstream. °· You may have a metallic taste in your mouth for a few minutes. °· The needle may cause some discomfort and bruising. °· After the contrast material is in your body, the imaging test will be done. °The procedure may vary among health care providers and hospitals. °What happens after the test with IV contrast material? °· You may be asked to drink water or other fluids to wash (flush) the contrast material out of your body. °· Drink enough fluid to keep your urine pale yellow. °· Do not drive for 24 hours if you received a sedative. °· It is your responsibility to get your test results. Ask your health care provider or the department performing the test when your results will be ready. °Contact a health care provider if: °· You have redness, swelling, or pain near your IV site. °Get help right away if: °· You have an abnormal heart rhythm. °· You have trouble breathing. °· You have: °? Chest pain. °? Pain in your back, neck, arm, jaw, or stomach. °? Nausea or sweating. °? Hives or a rash. °· You start shaking and cannot stop. °These symptoms may represent a serious problem that is an emergency. Do not wait to see if the symptoms will go away. Get medical help right away. Call your local emergency services (911 in the [place] Do not drive yourself to the hospital. °Summary °· IV contrast may be used for imaging tests to help your  health care providers see your organs and tissues more clearly. °· Tell your health care provider if you are pregnant or may be pregnant. °· During the procedure, you may feel warmth or flushing as the material enters your bloodstream. °· After the procedure, drink enough fluid to keep your urine pale yellow. °This information is not intended to replace advice given to you by your health care provider. Make sure you discuss any questions you have with your health care provider. °Document Released: 12/23/2008 Document Revised: 08/29/2017 Document Reviewed: 09/11/2014 °Elsevier Interactive Patient Education © 2019 [employer] ° ° °

## 2018-04-07 ENCOUNTER — Ambulatory Visit (INDEPENDENT_AMBULATORY_CARE_PROVIDER_SITE_OTHER): Payer: Managed Care, Other (non HMO) | Admitting: Family Medicine

## 2018-04-07 DIAGNOSIS — Z5329 Procedure and treatment not carried out because of patient's decision for other reasons: Secondary | ICD-10-CM

## 2018-04-14 ENCOUNTER — Telehealth: Payer: Self-pay

## 2018-04-14 NOTE — Telephone Encounter (Signed)
Attempted to contact pt change appointment on 3/31 to tele-health visit. Left message to call back.

## 2018-04-17 NOTE — Telephone Encounter (Signed)
TELEPHONE CALL NOTE  Veronica Morton has been deemed a candidate for a follow-up tele-health visit to limit community exposure during the Covid-19 pandemic. I spoke with the patient via phone to ensure availability of phone/video source, confirm preferred email & phone number, and discuss instructions and expectations.  I reminded Veronica Morton to be prepared with any vital sign and/or heart rhythm information that could potentially be obtained via home monitoring, at the time of her visit. I reminded Veronica Morton to expect a phone call at the time of her visit if her visit.  Did the patient verbally acknowledge consent to treatment? Yes  Meryl Crutch, RN 04/17/2018 9:16 AM   DOWNLOADING THE [place] TO SMARTPHONE  - If Apple, go to App Store and type in WebEx in the search bar. [employer], the blue/green circle. The app is free but as with any other app downloads, their phone may require them to verify saved payment information or Apple password. The patient does NOT have to create an account.  - If Android, ask patient to go to [employer] and type in WebEx in the search bar. [employer], the blue/green circle. The app is free but as with any other app downloads, their phone may require them to verify saved payment information or Android password. The patient does NOT have to create an account.   CONSENT FOR TELE-HEALTH VISIT - PLEASE REVIEW  I hereby voluntarily request, consent and authorize [facility] and its employed or contracted physicians, physician assistants, nurse practitioners or other licensed health care professionals (the Practitioner), to provide me with telemedicine health care services (the "Services") as deemed necessary by the treating Practitioner. I acknowledge and consent to receive the Services by the Practitioner via telemedicine. I understand that the telemedicine visit will involve communicating with the  Practitioner through live audiovisual communication technology and the disclosure of certain medical information by electronic transmission. I acknowledge that I have been given the opportunity to request an in-person assessment or other available alternative prior to the telemedicine visit and am voluntarily participating in the telemedicine visit.  I understand that I have the right to withhold or withdraw my consent to the use of telemedicine in the course of my care at any time, without affecting my right to future care or treatment, and that the Practitioner or I may terminate the telemedicine visit at any time. I understand that I have the right to inspect all information obtained and/or recorded in the course of the telemedicine visit and may receive copies of available information for a reasonable fee.  I understand that some of the potential risks of receiving the Services via telemedicine include:  Marland Kitchen Delay or interruption in medical evaluation due to technological equipment failure or disruption; . Information transmitted may not be sufficient (e.g. poor resolution of images) to allow for appropriate medical decision making by the Practitioner; and/or  . In rare instances, security protocols could fail, causing a breach of personal health information.  Furthermore, I acknowledge that it is my responsibility to provide information about my medical history, conditions and care that is complete and accurate to the best of my ability. I acknowledge that Practitioner's advice, recommendations, and/or decision may be based on factors not within their control, such as incomplete or inaccurate data provided by me or distortions of diagnostic images or specimens that may result from electronic transmissions. I understand that the practice of medicine is not an  exact science and that Practitioner makes no warranties or guarantees regarding treatment outcomes. I acknowledge that I will receive a copy of this  consent concurrently upon execution via email to the email address I last provided but may also request a printed copy by calling the office of [facility].    I understand that my insurance will be billed for this visit.   I have read or had this consent read to me. . I understand the contents of this consent, which adequately explains the benefits and risks of the Services being provided via telemedicine.  . I have been provided ample opportunity to ask questions regarding this consent and the Services and have had my questions answered to my satisfaction. . I give my informed consent for the services to be provided through the use of telemedicine in my medical care  By participating in this telemedicine visit I agree to the above.

## 2018-04-18 ENCOUNTER — Telehealth (INDEPENDENT_AMBULATORY_CARE_PROVIDER_SITE_OTHER): Payer: Managed Care, Other (non HMO) | Admitting: Cardiology

## 2018-04-18 ENCOUNTER — Other Ambulatory Visit: Payer: Self-pay

## 2018-04-18 DIAGNOSIS — Z91199 Patient's noncompliance with other medical treatment and regimen due to unspecified reason: Secondary | ICD-10-CM

## 2018-04-18 DIAGNOSIS — Z5329 Procedure and treatment not carried out because of patient's decision for other reasons: Secondary | ICD-10-CM

## 2018-04-18 NOTE — Progress Notes (Signed)
Patient did not keep scheduled appt.

## 2018-05-10 LAB — HM DIABETES EYE EXAM

## 2018-06-29 LAB — HM DIABETES EYE EXAM

## 2018-07-03 ENCOUNTER — Encounter: Payer: Self-pay | Admitting: Family Medicine

## 2018-07-12 ENCOUNTER — Other Ambulatory Visit: Payer: Self-pay | Admitting: Family Medicine

## 2018-07-12 DIAGNOSIS — I10 Essential (primary) hypertension: Secondary | ICD-10-CM

## 2018-07-18 ENCOUNTER — Encounter: Payer: Self-pay | Admitting: Family Medicine

## 2018-07-18 ENCOUNTER — Ambulatory Visit (INDEPENDENT_AMBULATORY_CARE_PROVIDER_SITE_OTHER): Payer: Managed Care, Other (non HMO) | Admitting: Family Medicine

## 2018-07-18 VITALS — BP 125/77 | HR 78 | Wt 205.0 lb

## 2018-07-18 DIAGNOSIS — E8881 Metabolic syndrome: Secondary | ICD-10-CM

## 2018-07-18 DIAGNOSIS — F325 Major depressive disorder, single episode, in full remission: Secondary | ICD-10-CM

## 2018-07-18 DIAGNOSIS — I10 Essential (primary) hypertension: Secondary | ICD-10-CM

## 2018-07-18 DIAGNOSIS — E785 Hyperlipidemia, unspecified: Secondary | ICD-10-CM | POA: Diagnosis not present

## 2018-07-18 DIAGNOSIS — Z131 Encounter for screening for diabetes mellitus: Secondary | ICD-10-CM

## 2018-07-18 MED ORDER — HYDROCHLOROTHIAZIDE 12.5 MG PO TABS: 12.5000 mg | ORAL_TABLET | Freq: Every day | ORAL | 1 refills | Status: DC

## 2018-07-18 NOTE — Progress Notes (Signed)
Name: Veronica Morton   MRN: [id number]    DOB: September 12, 1960   Date:07/18/2018       Progress Note  Subjective  Chief Complaint  Chief Complaint  Patient presents with   Medication Refill   Hypertension    I connected with  [name]  on 07/18/18 at  2:20 PM EDT by a video enabled telemedicine application and verified that I am speaking with the correct person using two identifiers.  I discussed the limitations of evaluation and management by telemedicine and the availability of in person appointments. The patient expressed understanding and agreed to proceed. Staff also discussed with the patient that there may be a patient responsible charge related to this service. Patient Location: at home  Provider Location: [facility]    HPI  Obesity: she started on Contrave on 03/19/2015 at 212 lbs, she stopped taking it October 2017 because she did not notice any progression of weight loss. She started a program called [employer] Life with meal planning and life coach and she did well with weight loss and was able to come off HCTZ because bp normalized . She  Lost a total of  17 lbs, weight went down to  191 lbs, however gradually started to gain her weight back and we tried Belviq that did not work. She started on Qsymia 02/2017 and hadlost down again to 191 lbs, however had  problems with insurance and once she stopped Qsymia because of cost, her weight went up again,  we resumed medication 09/2017 at weight of 202 , but her bp spiked and she stopped it again 11/2017 and weight has been stable since. She just joined [employer] 01/18/2018. We added Ozempic in January but she did not lose weight and stopped medication. Discussed intermittent fasting and to walk more   HTN:she was off medication and weight went down to 191 lbs but had to resume once weight went up and bp started to spike again, she states since back on hctz bp has well controlled. She denies chest  pain, palpitation or SOB at this time  Dyslipidemia: she is due for repeat labs, I will place order today and she will return to have it done fasting.   Major Depression in remission :she has stopped medication ( Lexapro )started Fall 2018, but stopped on her ownwithin 2 months because she did not noticed improvement of symptoms.lost her father 02/2017 and mother has Alzheimer's in 02/ 2019, she never started zoloft, but has a new grandson now and is coping well, she wants to stay off medication, phq 9 is still normal even with COVID-19 and we will continue to monitor for now. She states stress is down since she has been working from home   Metabolic syndrome: based on increase in abdominal girth, elevated bp , and hyperglycemia.She denies polyphagia, polydipsia or polyuria. She will return for labs    Patient Active Problem List   Diagnosis Date Noted   Class 2 severe obesity due to excess calories with serious comorbidity and body mass index (BMI) of 38.0 to 38.9 in adult [facility]) 02/20/2018   Pure hypercholesterolemia 02/20/2018   Essential hypertension 02/20/2018   Glaucoma, narrow-angle 09/14/2016   Left breast mass 03/18/2016   Abnormal mammogram of left breast 08/05/2015   History of skin cancer of unknown type 01/22/2015   Dyslipidemia 08/29/2014   H/O knee surgery 08/29/2014   Menopause 08/29/2014   History of anemia 56/97/9480   Dysmetabolic syndrome 16/55/3748  Decreased ferritin 08/29/2014   Obesity (BMI 30-39.9) 08/29/2014   DJD (degenerative joint disease) of knee 03/07/2013   History of pneumonia 12/28/2012    Past Surgical History:  Procedure Laterality Date   APPENDECTOMY     BREAST BIOPSY Left 2018   COMPLETED IN [facility]ICE - NEG   GLAUCOMA SURGERY Right 2016-04-13   HEAD & NECK SKIN LESION EXCISIONAL BIOPSY  01/22/2015   Dr. Grace Bushy at [place] Left    ankle/ Dr Bary Castilla    TOTAL KNEE ARTHROPLASTY  08/04/2011   Procedure: TOTAL KNEE ARTHROPLASTY;  Surgeon: Ninetta Lights, MD;  Location: [place];  Service: Orthopedics;  Laterality: Right;   TOTAL KNEE ARTHROPLASTY Left 03/07/2013   Procedure: LEFT TOTAL KNEE ARTHROPLASTY;  Surgeon: Ninetta Lights, MD;  Location: [place];  Service: Orthopedics;  Laterality: Left;   TUBAL LIGATION      Family History  Problem Relation Age of Onset   Rectal cancer Father 40   Diabetes Father    Diabetes Mother    Breast cancer Mother 69   Osteoporosis Mother    Diabetes Sister    Heart disease Sister    Hypertension Brother    Colon cancer Paternal Grandmother     Social History   Socioeconomic History   Marital status: Married    Spouse name: Sharlet Salina    Number of children: 3   Years of education: Not on file   Highest education level: Bachelor's degree (e.g., BA, AB, BS)  Occupational History   Occupation: Financial planner   Social Needs   Financial resource strain: Not hard at all   Food insecurity    Worry: Never true    Inability: Never true   Transportation needs    Medical: No    Non-medical: No  Tobacco Use   Smoking status: Never Smoker   Smokeless tobacco: Never Used  Substance and Sexual Activity   Alcohol use: Yes    Alcohol/week: 0.0 standard drinks    Comment: occassionally-social   Drug use: No   Sexual activity: Not Currently  Lifestyle   Physical activity    Days per week: 4 days    Minutes per session: Not on file   Stress: Not on file  Relationships   Social connections    Talks on phone: More than three times a week    Gets together: More than three times a week    Attends religious service: More than 4 times per year    Active member of club or organization: No    Attends meetings of clubs or organizations: Never    Relationship status: Married   Intimate partner violence    Fear of current or ex partner: No    Emotionally abused: No    Physically  abused: No    Forced sexual activity: No  Other Topics Concern   Not on file  Social History Narrative   Married, husband is 79 years older, and had a hemorrhagic stroke on July 4th, 2018 and still has left side weakness   Father died April 13, 2016   Works IT department at [employer]     Current Outpatient Medications:    Biotin 10000 MCG TABS, Take by mouth., Disp: , Rfl:    calcium-vitamin D 250-100 MG-UNIT tablet, Take 1 tablet by mouth 2 (two) times daily., Disp: , Rfl:    Carboxymethylcellul-Glycerin (REFRESH OPTIVE OP), Apply to eye., Disp: , Rfl:  cetirizine (ZYRTEC) 10 MG tablet, Take 10 mg by mouth as needed. , Disp: , Rfl:    dorzolamide (TRUSOPT) 2 % ophthalmic solution, 1 drop 3 (three) times daily., Disp: , Rfl:    Doxylamine Succinate, Sleep, (SLEEP AID PO), Take by mouth., Disp: , Rfl:    hydrochlorothiazide (HYDRODIURIL) 12.5 MG tablet, TAKE 1 TABLET BY MOUTH  DAILY, Disp: 90 tablet, Rfl: 0   valACYclovir (VALTREX) 1000 MG tablet, Take 1 tablet (1,000 mg total) by mouth 2 (two) times daily. Takes 1 pill bid prn fever blisters, Disp: 60 tablet, Rfl: 2   vitamin B-12 (CYANOCOBALAMIN) 1000 MCG tablet, Take 1,000 mcg by mouth daily., Disp: , Rfl:   No Known Allergies  I personally reviewed active problem list, medication list, allergies, family history with the patient/caregiver today.   ROS  Ten systems reviewed and is negative except as mentioned in HPI   Objective  Virtual encounter, vitals not obtained.  There is no height or weight on file to calculate BMI.  Physical Exam  Awake, alert and oriented   PHQ2/9: Depression screen [facility] 2/9 01/25/2018 10/14/2017 07/13/2017 05/09/2017 03/16/2017  Decreased Interest 0 0 0 0 2  Down, Depressed, Hopeless 0 0 0 0 3  PHQ - 2 Score 0 0 0 0 5  Altered sleeping 0 0 1 3 0  Tired, decreased energy 0 0 0 1 1  Change in appetite 0 1 0 1 2  Feeling bad or failure about yourself  0 1 0 2 0  Trouble concentrating 0 0 0 0 2    Moving slowly or fidgety/restless 0 0 0 1 1  Suicidal thoughts 0 0 0 0 0  PHQ-9 Score 0 2 1 8 11   Difficult doing work/chores Not difficult at all Not difficult at all Not difficult at all Somewhat difficult Somewhat difficult   PHQ-2/9 Result is negative.    Fall Risk: Fall Risk  01/25/2018 10/14/2017 07/13/2017 05/09/2017 03/16/2017  Falls in the past year? 0 No No No No  Number falls in past yr: 0 - - - -  Injury with Fall? 0 - - - -     Assessment & Plan  1. Hypertension, benign  - CBC with Differential/Platelet - Comprehensive metabolic panel - hydrochlorothiazide (HYDRODIURIL) 12.5 MG tablet; Take 1 tablet (12.5 mg total) by mouth daily.  Dispense: 90 tablet; Refill: 1  2. Major depression in remission [facility])  Doing well   3. Morbid obesity ([place])  Discussed with the patient the risk posed by an increased BMI. Discussed importance of portion control, calorie counting and at least 150 minutes of physical activity weekly. Avoid sweet beverages and drink more water. Eat at least 6 servings of fruit and vegetables daily   4. Dyslipidemia  - Lipid panel  5. Diabetes mellitus screening  - Hemoglobin A1c  6. Dysmetabolic syndrome  - Hemoglobin A1c  7. Hypercalcemia  - VITAMIN D 25 Hydroxy (Vit-D Deficiency, Fractures) - Parathyroid hormone, intact (no Ca) I discussed the assessment and treatment plan with the patient. The patient was provided an opportunity to ask questions and all were answered. The patient agreed with the plan and demonstrated an understanding of the instructions.  The patient was advised to call back or seek an in-person evaluation if the symptoms worsen or if the condition fails to improve as anticipated.  I provided 25  minutes of non-face-to-face time during this encounter.

## 2018-07-28 LAB — LIPID PANEL
Chol/HDL Ratio: 5.1 ratio — ABNORMAL HIGH (ref 0.0–4.4)
Cholesterol, Total: 200 mg/dL — ABNORMAL HIGH (ref 100–199)
HDL: 39 mg/dL — ABNORMAL LOW (ref >39)
LDL Calculated: 117 mg/dL — ABNORMAL HIGH (ref 0–99)
Triglycerides: 218 mg/dL — ABNORMAL HIGH (ref 0–149)
VLDL Cholesterol Cal: 44 mg/dL — ABNORMAL HIGH (ref 5–40)

## 2018-07-28 LAB — COMPREHENSIVE METABOLIC PANEL
ALT: 45 IU/L — ABNORMAL HIGH (ref 0–32)
AST: 30 IU/L (ref 0–40)
Albumin/Globulin Ratio: 2 (ref 1.2–2.2)
Albumin: 4.3 g/dL (ref 3.8–4.9)
Alkaline Phosphatase: 65 IU/L (ref 39–117)
BUN/Creatinine Ratio: 14 (ref 9–23)
BUN: 11 mg/dL (ref 6–24)
Bilirubin Total: 0.9 mg/dL (ref 0.0–1.2)
CO2: 26 mmol/L (ref 20–29)
Calcium: 9.9 mg/dL (ref 8.7–10.2)
Chloride: 105 mmol/L (ref 96–106)
Creatinine, Ser: 0.79 mg/dL (ref 0.57–1.00)
GFR calc Af Amer: 97 mL/min/{1.73_m2} (ref >59)
GFR calc non Af Amer: 85 mL/min/{1.73_m2} (ref >59)
Globulin, Total: 2.1 g/dL (ref 1.5–4.5)
Glucose: 95 mg/dL (ref 65–99)
Potassium: 3.9 mmol/L (ref 3.5–5.2)
Sodium: 144 mmol/L (ref 134–144)
Total Protein: 6.4 g/dL (ref 6.0–8.5)

## 2018-07-28 LAB — CBC WITH DIFFERENTIAL/PLATELET
Basophils Absolute: 0.1 10*3/uL (ref 0.0–0.2)
Basos: 1 %
EOS (ABSOLUTE): 0.4 10*3/uL (ref 0.0–0.4)
Eos: 5 %
Hematocrit: 42.4 % (ref 34.0–46.6)
Hemoglobin: 14.5 g/dL (ref 11.1–15.9)
Immature Grans (Abs): 0 10*3/uL (ref 0.0–0.1)
Immature Granulocytes: 0 %
Lymphocytes Absolute: 2.5 10*3/uL (ref 0.7–3.1)
Lymphs: 30 %
MCH: 28.4 pg (ref 26.6–33.0)
MCHC: 34.2 g/dL (ref 31.5–35.7)
MCV: 83 fL (ref 79–97)
Monocytes Absolute: 0.7 10*3/uL (ref 0.1–0.9)
Monocytes: 8 %
Neutrophils Absolute: 4.6 10*3/uL (ref 1.4–7.0)
Neutrophils: 56 %
Platelets: 192 10*3/uL (ref 150–450)
RBC: 5.1 x10E6/uL (ref 3.77–5.28)
RDW: 13.1 % (ref 11.7–15.4)
WBC: 8.3 10*3/uL (ref 3.4–10.8)

## 2018-07-28 LAB — HEMOGLOBIN A1C
Est. average glucose Bld gHb Est-mCnc: 105 mg/dL
Hgb A1c MFr Bld: 5.3 % (ref 4.8–5.6)

## 2018-07-28 LAB — PARATHYROID HORMONE, INTACT (NO CA): PTH: 28 pg/mL (ref 15–65)

## 2018-07-28 LAB — VITAMIN D 25 HYDROXY (VIT D DEFICIENCY, FRACTURES): Vit D, 25-Hydroxy: 25.9 ng/mL — ABNORMAL LOW (ref 30.0–100.0)

## 2018-09-06 IMAGING — US US BREAST*L* LIMITED INC AXILLA
1 series · 13 of 25 positions shown · non-contrast
Comparison: Previous exam(s).

CLINICAL DATA: Left breast follow-up

EXAM:
2D DIGITAL DIAGNOSTIC BILATERAL MAMMOGRAM WITH CAD AND ADJUNCT TOMO
ULTRASOUND LEFT BREAST

[Series 1: us breast*left* limited inc axilla · 0.05mm/px · 13 of 62 slices shown]
[im 1/62]
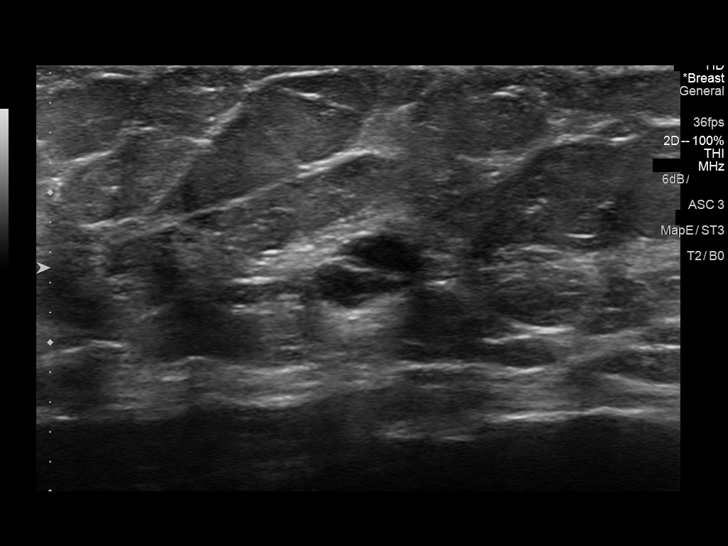
[im 6/62]
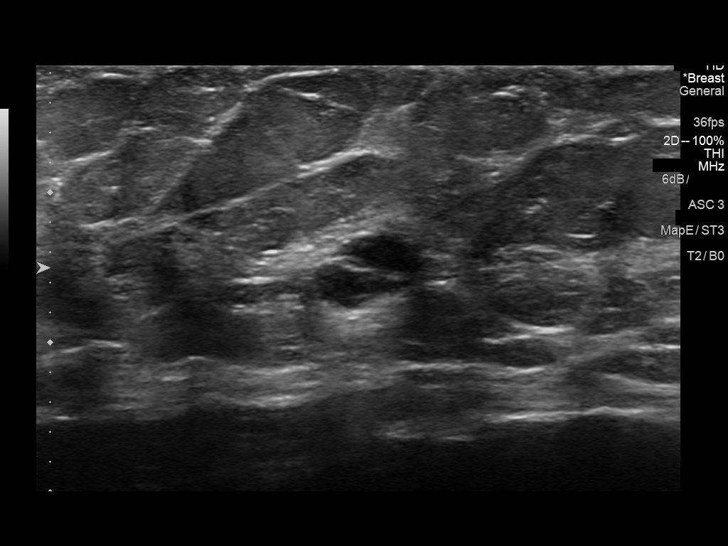
[im 11/62]
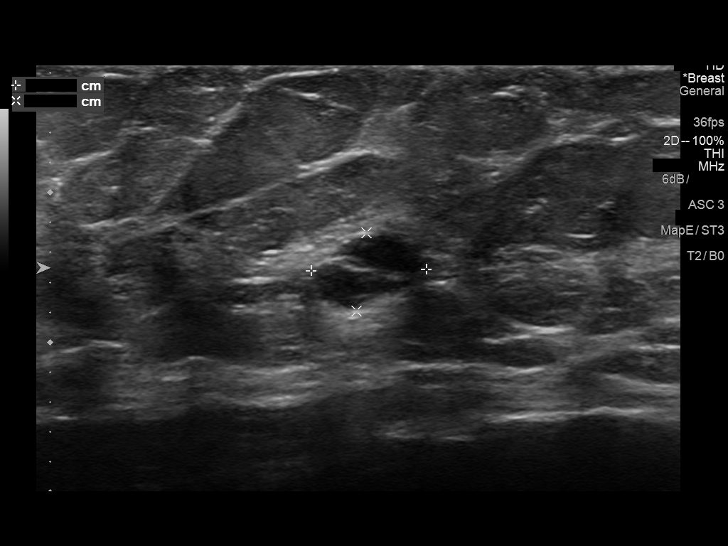
[im 16/62]
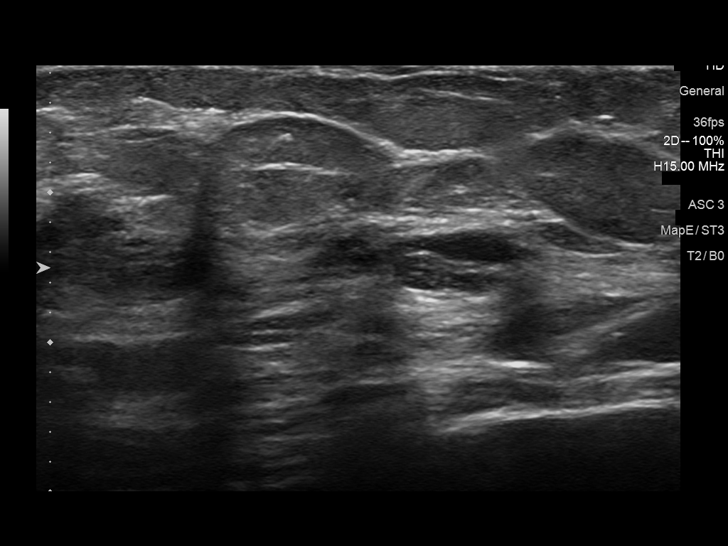
[im 21/62]
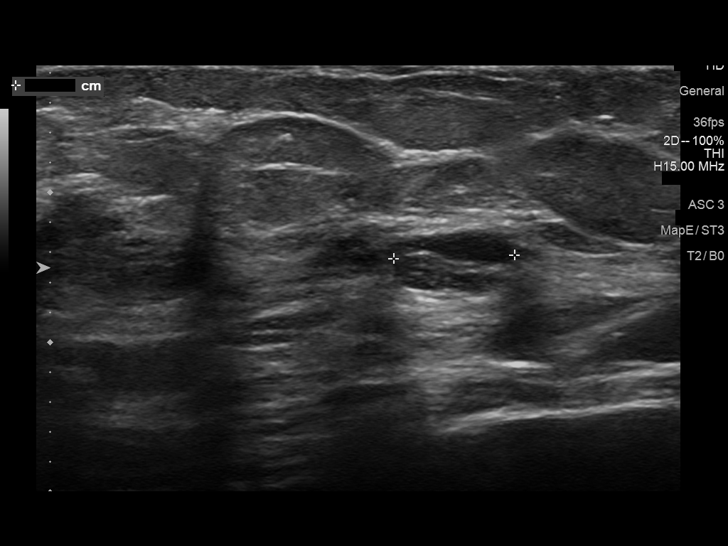
[im 26/62]
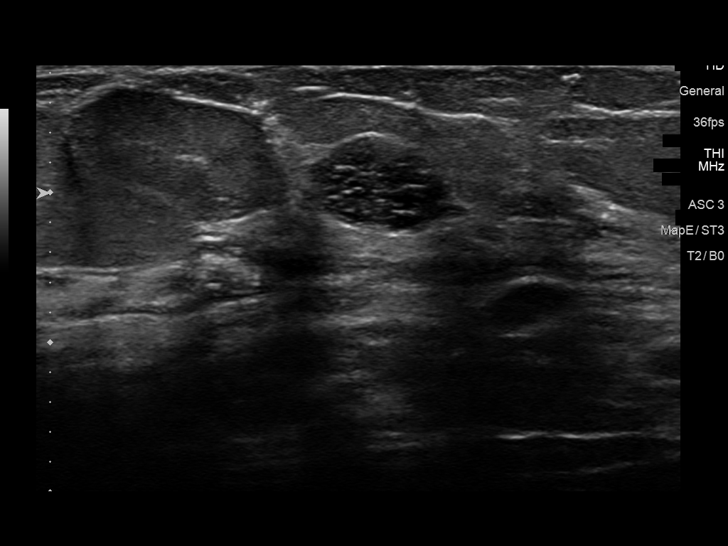
[im 31/62]
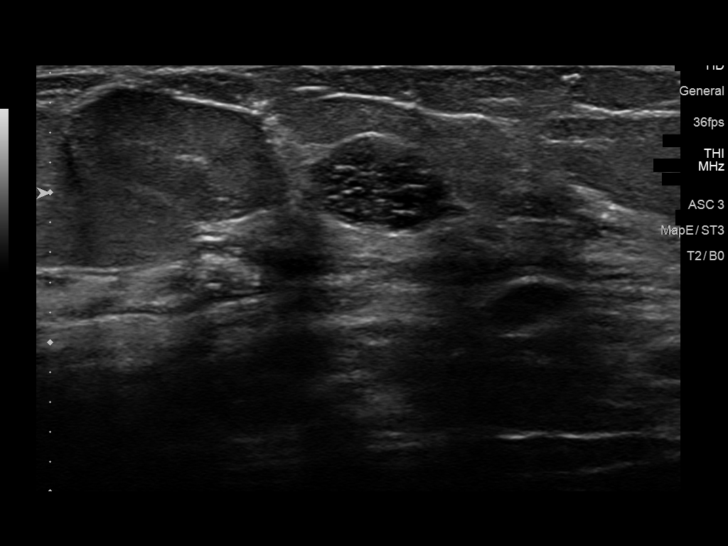
[im 36/62]
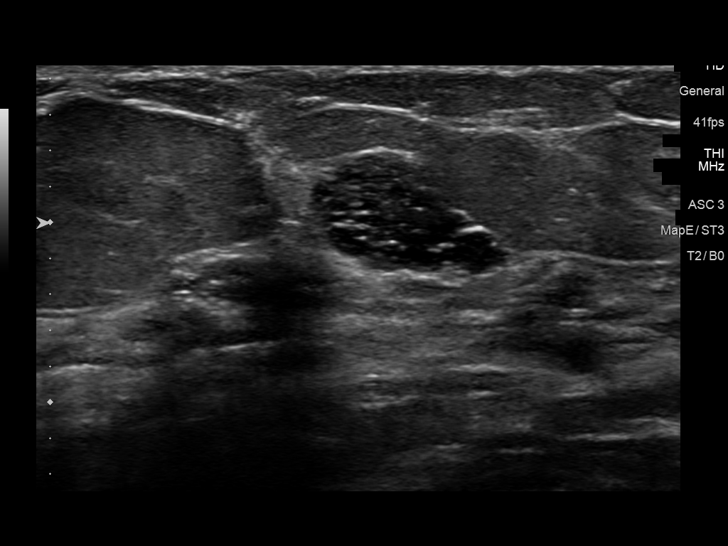
[im 41/62]
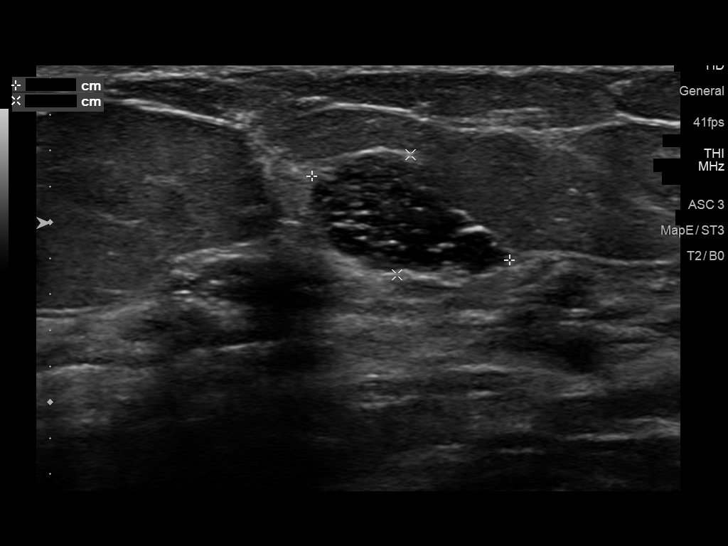
[im 46/62]
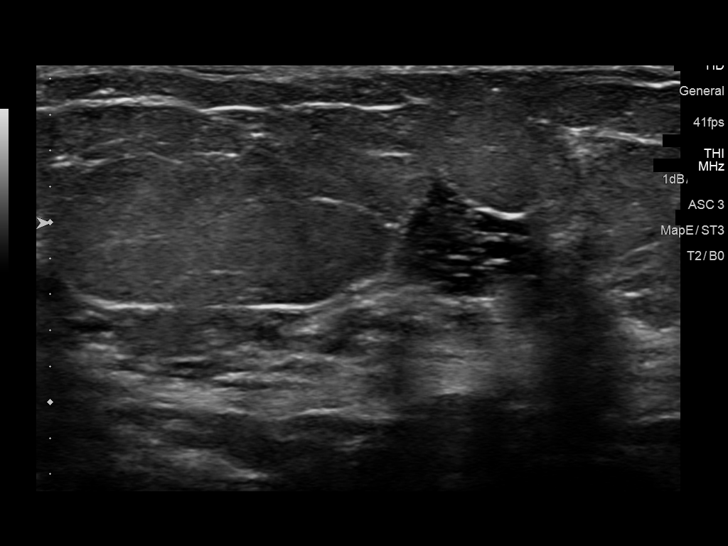
[im 51/62]
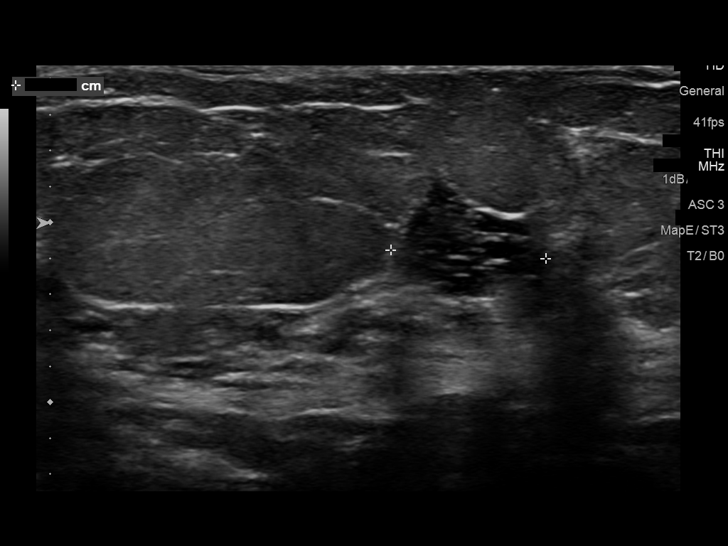
[im 56/62]
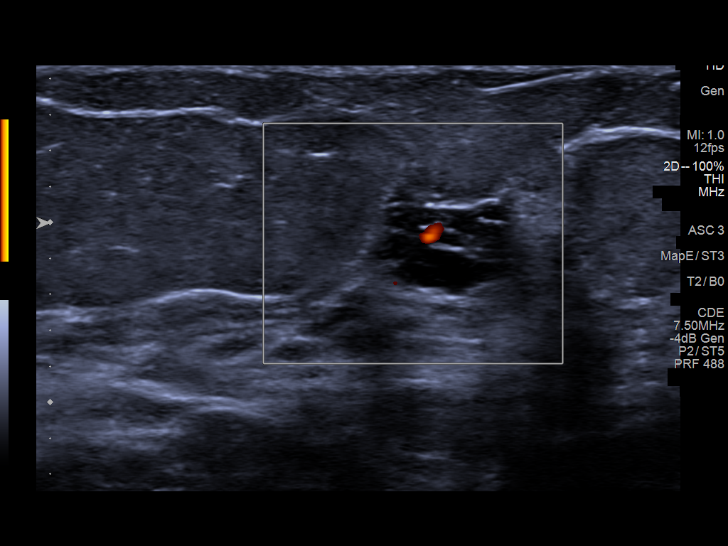
[im 62/62]
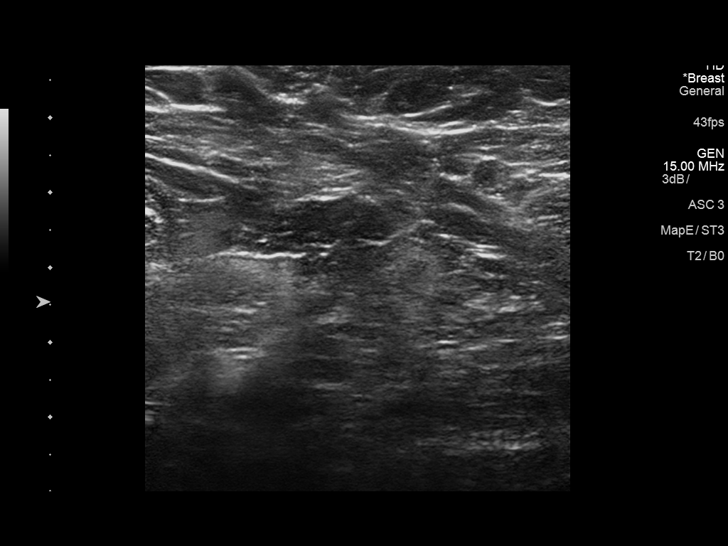

[13 of 25 positions shown; findings below may reference images not displayed]

ACR Breast Density Category c: The breast tissue is heterogeneously
dense, which may obscure small masses.
FINDINGS: No suspicious mass, calcifications, or other abnormality is
identified within the right breast. There is an oval, circumscribed
mass within the lateral left breast measuring 13 mm. No additional
abnormality is identified within the left breast.

Mammographic images were processed with CAD.

Targeted ultrasound of the lateral left breast was performed
demonstrating an oval, circumscribed, mixed cystic and solid mass at
3 o'clock, 4 cm from the nipple measuring 12 x 7 x 9 mm which is
thought to correspond to the mass seen mammographically. Internal
vascularity is noted. Ultrasound of the left axilla demonstrates no
suspicious appearing axillary lymph nodes.

The previously described mass at 4 o'clock, 2 cm from the nipple is
seen at 4 o'clock, 4 cm from the nipple on today's exam measuring 8
x 5 x 8 mm, not significantly changed and thought to represent a
complicated cyst. The previously described mass at 5 o'clock, 2 cm
from the nipple is not identified on today's exam.
IMPRESSION: 1. Indeterminate left breast mass at 3 o'clock, 4 cm from the
nipple.
2. Probably benign left breast mass at 4 o'clock, 4 cm from the
nipple.

RECOMMENDATION:
1. Ultrasound-guided left breast biopsy.
2. Bilateral diagnostic mammogram and left breast ultrasound in 1
year for follow-up of the probable complicated cyst at 4 o'clock.

I have discussed the findings and recommendations with the patient.
Results were also provided in writing at the conclusion of the
visit. If applicable, a reminder letter will be sent to the patient
regarding the next appointment.

BI-RADS CATEGORY  4: Suspicious.

## 2018-11-17 ENCOUNTER — Other Ambulatory Visit: Payer: Self-pay | Admitting: Family Medicine

## 2018-11-17 DIAGNOSIS — I10 Essential (primary) hypertension: Secondary | ICD-10-CM

## 2019-01-02 LAB — LIPID PANEL
Cholesterol: 227 — AB (ref 0–200)
HDL: 41 (ref 35–70)
LDL Cholesterol: 159
Triglycerides: 149 (ref 40–160)

## 2019-01-02 LAB — HEPATIC FUNCTION PANEL
ALT: 25 (ref 7–35)
AST: 19 (ref 13–35)

## 2019-01-02 LAB — BASIC METABOLIC PANEL: Glucose: 97

## 2019-01-02 LAB — HEMOGLOBIN A1C: Hemoglobin A1C: 5.5

## 2019-01-02 LAB — CBC AND DIFFERENTIAL
Hemoglobin: 15 (ref 12.0–16.0)
Platelets: 199 (ref 150–399)
WBC: 6.9

## 2019-03-16 ENCOUNTER — Other Ambulatory Visit: Payer: Self-pay | Admitting: Family Medicine

## 2019-03-16 DIAGNOSIS — I10 Essential (primary) hypertension: Secondary | ICD-10-CM

## 2019-03-16 NOTE — Telephone Encounter (Signed)
Requested medications are due for refill today?  NO - patient is requesting a year supply.    Requested medications are on active medication list? Yes  Last Refill:  11/17/2019   # 90 with 1 refill  - patient should have medication on hand  Future visit scheduled?  No - last seen 8 months ago.    Notes to Clinic:  Per protocol patient should be seen every 6 months.  Patient needs office visit.

## 2019-03-19 NOTE — Telephone Encounter (Signed)
Called for scheduling. MB is full

## 2019-04-01 ENCOUNTER — Other Ambulatory Visit: Payer: Self-pay | Admitting: Family Medicine

## 2019-04-01 DIAGNOSIS — I10 Essential (primary) hypertension: Secondary | ICD-10-CM

## 2019-04-02 NOTE — Telephone Encounter (Signed)
Requested Prescriptions  Pending Prescriptions Disp Refills  . hydrochlorothiazide (HYDRODIURIL) 12.5 MG tablet [Pharmacy Med Name: HYDROCHLOROTHIAZIDE  12.5MG   TAB] 90 tablet 3    Sig: TAKE 1 TABLET BY MOUTH ONCE DAILY     Cardiovascular: Diuretics - Thiazide Failed - 04/01/2019  9:21 PM      Failed - Valid encounter within last 6 months    Recent Outpatient Visits          8 months ago Hypertension, benign   [facility] Steele Sizer, MD   12 months ago    [facility] Steele Sizer, MD   1 year ago Hypertension, benign   [facility] Steele Sizer, MD   1 year ago Obesity (BMI 30-39.9)   [facility], Drue Stager, MD   1 year ago Hypertension, benign   [facility], Drue Stager, MD             Passed - Ca in normal range and within 360 days    Calcium  Date Value Ref Range Status  07/27/2018 9.9 8.7 - 10.2 mg/dL Final         Passed - Cr in normal range and within 360 days    Creatinine, Ser  Date Value Ref Range Status  07/27/2018 0.79 0.57 - 1.00 mg/dL Final         Passed - K in normal range and within 360 days    Potassium  Date Value Ref Range Status  07/27/2018 3.9 3.5 - 5.2 mmol/L Final         Passed - Na in normal range and within 360 days    Sodium  Date Value Ref Range Status  07/27/2018 144 134 - 144 mmol/L Final         Passed - Last BP in normal range    BP Readings from Last 1 Encounters:  07/18/18 125/77

## 2019-04-15 ENCOUNTER — Other Ambulatory Visit: Payer: Self-pay | Admitting: Family Medicine

## 2019-04-15 DIAGNOSIS — I10 Essential (primary) hypertension: Secondary | ICD-10-CM

## 2019-04-15 MED ORDER — HYDROCHLOROTHIAZIDE 12.5 MG PO TABS: 12.5000 mg | ORAL_TABLET | Freq: Every day | ORAL | 0 refills | Status: AC

## 2019-04-18 ENCOUNTER — Ambulatory Visit (INDEPENDENT_AMBULATORY_CARE_PROVIDER_SITE_OTHER): Payer: Managed Care, Other (non HMO) | Admitting: Family Medicine

## 2019-04-18 ENCOUNTER — Encounter: Payer: Self-pay | Admitting: Family Medicine

## 2019-04-18 VITALS — Wt 208.0 lb

## 2019-04-18 DIAGNOSIS — I10 Essential (primary) hypertension: Secondary | ICD-10-CM

## 2019-04-18 DIAGNOSIS — E785 Hyperlipidemia, unspecified: Secondary | ICD-10-CM

## 2019-04-18 DIAGNOSIS — E8881 Metabolic syndrome: Secondary | ICD-10-CM

## 2019-04-18 DIAGNOSIS — E669 Obesity, unspecified: Secondary | ICD-10-CM

## 2019-04-18 NOTE — Progress Notes (Signed)
Name: Veronica Morton   MRN: [id number]    DOB: March 01, 1960   Date:04/18/2019       Progress Note  Subjective  Chief Complaint  Chief Complaint  Patient presents with  . Follow-up    I connected with  Veronica Morton on 04/18/19 at 10:20 AM EDT by telephone and verified that I am speaking with the correct person using two identifiers.  I discussed the limitations, risks, security and privacy concerns of performing an evaluation and management service by telephone and the availability of in person appointments. Staff also discussed with the patient that there may be a patient responsible charge related to this service. Patient Location: at home  Provider Location: [facility]   HPI  Obesity: she started on Contrave on 03/19/2015 at 212 lbs, she stopped taking it October 2017 because she did not notice any progression of weight loss. She started a program called Take Shape for Life with meal planning and life coachand she did well with weight loss and was able to come off HCTZ because bp normalized .She  Lost a total of  17 lbs, weight went down to  191 lbs, howevergradually started to gain her weight back and we tried Belviq that did not work. Shestarted on Qsymia 02/2017 and hadlost downagain to191 lbs, however had  problems with insurance and once she stopped Qsymia because of cost, her weight went up again,  we resumed medication 09/2017 at weight of 202 , but her bp spiked and she stopped it again 11/2017 and weight has been stable since. She joined [employer] 01/18/2018 but did not work, she only followed for one month.  We added Ozempic in January but she did not lose weight and stopped medication. She gained weight through the pandemic - she has gained 12 lbs since last March , today's weight at home 208 lbs. She is currently following the [employer] March 2021 - very low carb diet   HTN:she was off medication and weight went down to 191 lbs but had to resume once weight went  up and bp started to spike again, she states since back on hctz, she has not checked her bp lately, but during [facility] bp was at goal with another PCP - that is closer to her home and she will return to [place] if she has to go back to the [employer] building.   Major Depression in remission :she has stopped medication ( Lexapro )started Fall 2018, but stopped on her ownwithin 2 months because she did not noticed improvement of symptoms.lost her father 02/2017 and mother has Alzheimer'sin 02/ 2019, she never started zoloft,but has been off medication and doing well, working from home   Metabolic syndrome: based on increase in abdominal girth, elevated bp, she had labs end of 2020 at [facility] - near her home and we will abstract values, explained her LDL had gone up, but she has changed her diet since  Patient Active Problem List   Diagnosis Date Noted  . Class 2 severe obesity due to excess calories with serious comorbidity and body mass index (BMI) of 38.0 to 38.9 in adult ([place]) 02/20/2018  . Pure hypercholesterolemia 02/20/2018  . Essential hypertension 02/20/2018  . 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 Left breast mass 03/18/2016  . Abnormal mammogram of left breast 08/05/2015  . History of skin cancer of unknown type 01/22/2015  . Dyslipidemia 08/29/2014  . H/O knee surgery 08/29/2014  . Menopause 08/29/2014  . History  of anemia 08/29/2014  . Dysmetabolic syndrome [id number]  . Decreased ferritin 08/29/2014  . Obesity (BMI 30-39.9) 08/29/2014  . 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 History of pneumonia 12/28/2012    Past Surgical History:  Procedure Laterality Date  . APPENDECTOMY    . BREAST BIOPSY Left 2018   COMPLETED IN [facility]ICE - NEG  . GLAUCOMA SURGERY Right 02/2016  . HEAD & NECK SKIN LESION EXCISIONAL BIOPSY  01/22/2015   Dr[facility] at [facility]  . JOINT REPLACEMENT    . MASS EXCISION Left    ankle/ Dr Bary Castilla    . TOTAL KNEE ARTHROPLASTY  08/04/2011   Procedure: TOTAL KNEE ARTHROPLASTY;  Surgeon: Ninetta Lights, MD;  Location: [place];  Service: Orthopedics;  Laterality: Right;  . TOTAL KNEE ARTHROPLASTY Left 03/07/2013   Procedure: LEFT TOTAL KNEE ARTHROPLASTY;  Surgeon: Ninetta Lights, MD;  Location: [place];  Service: Orthopedics;  Laterality: Left;  . TUBAL LIGATION      Family History  Problem Relation Age of Onset  . Rectal cancer Father 77  . Diabetes Father   . Diabetes Mother   . Breast cancer Mother 71  . Osteoporosis Mother   . Diabetes Sister   . Heart disease Sister   . Hypertension Brother   . Colon cancer Paternal Grandmother     Social History   Tobacco Use  . Smoking status: Never Smoker  . Smokeless tobacco: Never Used  Substance Use Topics  . Alcohol use: Yes    Alcohol/week: 0.0 standard drinks    Comment: occassionally-social    Current Outpatient Medications:  .  Biotin 10000 MCG TABS, Take by mouth., Disp: , Rfl:  .  calcium-vitamin D 250-100 MG-UNIT tablet, Take 1 tablet by mouth 2 (two) times daily., Disp: , Rfl:  .  Carboxymethylcellul-Glycerin (REFRESH OPTIVE OP), Apply to eye., Disp: , Rfl:  .  cetirizine (ZYRTEC) 10 MG tablet, Take 10 mg by mouth as needed. , Disp: , Rfl:  .  dorzolamide (TRUSOPT) 2 % ophthalmic solution, 1 drop 3 (three) times daily., Disp: , Rfl:  .  Doxylamine Succinate, Sleep, (SLEEP AID PO), Take by mouth., Disp: , Rfl:  .  hydrochlorothiazide (HYDRODIURIL) 12.5 MG tablet, Take 1 tablet (12.5 mg total) by mouth daily., Disp: 30 tablet, Rfl: 0 .  valACYclovir (VALTREX) 1000 MG tablet, Take 1 tablet (1,000 mg total) by mouth 2 (two) times daily. Takes 1 pill bid prn fever blisters, Disp: 60 tablet, Rfl: 2 .  vitamin B-12 (CYANOCOBALAMIN) 1000 MCG tablet, Take 1,000 mcg by mouth daily., Disp: , Rfl:   No Known Allergies  I personally reviewed active problem list, medication list, allergies, family history, social history, health  maintenance with the patient/caregiver today.   ROS  Ten systems reviewed and is negative except as mentioned in HPI   Objective  Virtual encounter, vitals not obtained.  Body mass index is 38.04 kg/m.  Physical Exam  Awake, alert and oriented   PHQ2/9: Depression screen [facility] 2/9 04/18/2019 01/25/2018 10/14/2017 07/13/2017 05/09/2017  Decreased Interest 0 0 0 0 0  Down, Depressed, Hopeless 0 0 0 0 0  PHQ - 2 Score 0 0 0 0 0  Altered sleeping 0 0 0 1 3  Tired, decreased energy 0 0 0 0 1  Change in appetite 0 0 1 0 1  Feeling bad or failure about yourself  0 0 1 0 2  Trouble concentrating 0 0 0 0 0  Moving slowly or fidgety/restless 0 0 0 0 1  Suicidal thoughts 0 0 0 0 0  PHQ-9 Score 0 0 2 1 8   Difficult doing work/chores - Not difficult at all Not difficult at all Not difficult at all Somewhat difficult   PHQ-2/9 Result is negative.    Fall Risk: Fall Risk  04/18/2019 01/25/2018 10/14/2017 07/13/2017 05/09/2017  Falls in the past year? 0 0 No No No  Number falls in past yr: 0 0 - - -  Injury with Fall? 0 0 - - -     Assessment & Plan  1. Hypertension, benign  Advised to monitor bp at home, she has refills of medication   2. Dysmetabolic syndrome  She is on a very low carb diet now  3. Dyslipidemia  She has changed her diet, recheck yearly   4. 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 She lives 40 minutes from [place]   I discussed the assessment and treatment plan with the patient. The patient was provided an opportunity to ask questions and all were answered. The patient agreed with the plan and demonstrated an understanding of the instructions.   The patient was advised to call back or seek an in-person evaluation if the symptoms worsen or if the condition  fails to improve as anticipated.  I provided 15  minutes of non-face-to-face time during this encounter.  Loistine Chance, MD

## 2019-05-03 LAB — HM DIABETES EYE EXAM

## 2020-05-08 NOTE — Progress Notes (Signed)
[facility] report-stable glaucoma/scanned

## 2020-05-13 ENCOUNTER — Encounter: Payer: Self-pay | Admitting: Family Medicine

## 2020-08-07 IMAGING — CT CT HEAR MORPH WITH CTA COR WITH SCORE WITH CA WITH CONTRAST AND
4 of 7 series · 8 of 20 positions shown, 9 images · non-contrast
Comparison: None.
COMPARISON: None.

Addendum:
EXAM:
OVER-READ INTERPRETATION  CT CHEST

The following report is an over-read performed by radiologist Dr.
Blezz Risher [REDACTED] on 04/05/2018. This
over-read does not include interpretation of cardiac or coronary
anatomy or pathology. The coronary calcium score/coronary CTA
interpretation by the cardiologist is attached.
CLINICAL DATA: Chest pain
Cardiac CTA
MEDICATIONS:
Sub lingual nitro. 4 mg and lopressor 5mg
TECHNIQUE: The patient was scanned on a Siemens Force [id number] scanner. Gantry
rotation speed was 250 msecs. Collimation was. 6 mm . A 120 kV
prospective scan was triggered in the ascending thoracic aorta at
140 HU's with full mA between 30-70% of the R-R interval . Average
HR during the scan was 61 bpm. The 3D data set was interpreted on a
dedicated work station using MPR, MIP and VRT modes. A total of 80
cc of contrast was used.

[Series 6: best diast 74 % · axial · 0.39mm/px · z∈[-135,-98]mm · 2 of 276 slices shown]
[im 92/276  vessel]
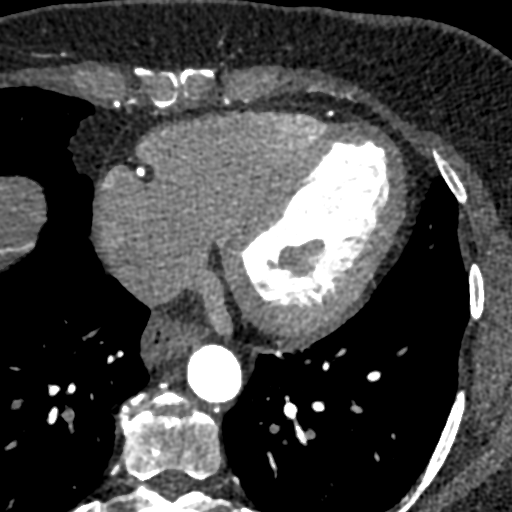
[im 184/276  vessel]
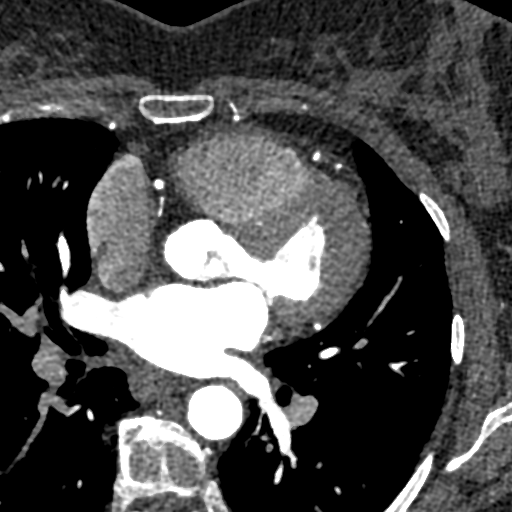

[Series 7: best syst · axial · 0.39mm/px · z∈[-135,-98]mm · 2 of 276 slices shown, 3 images]
[im 92/276  vessel]
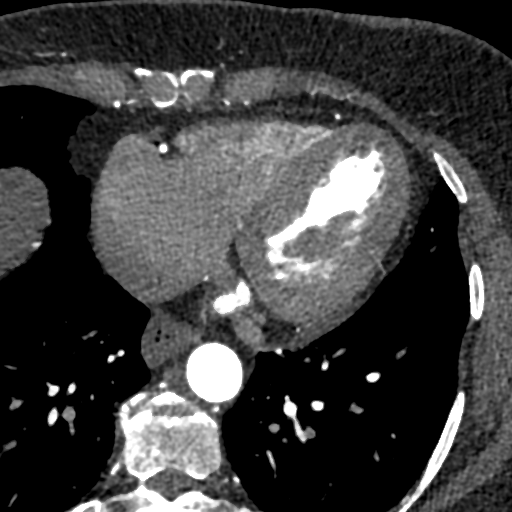
[im 92/276  lung]
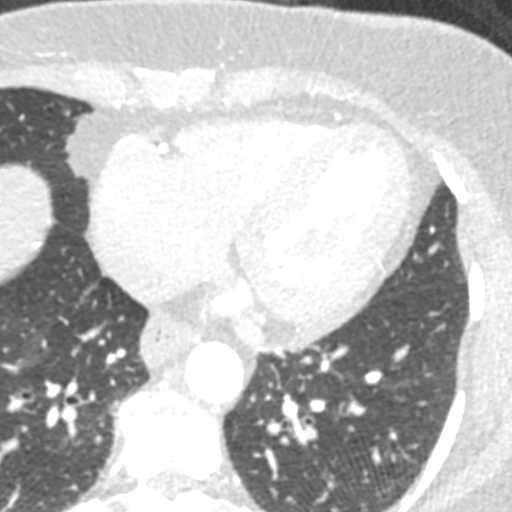
[im 184/276  vessel]
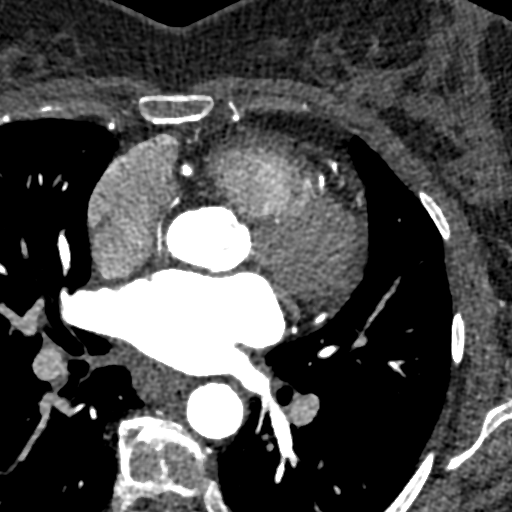

[Series 8: ts diast sharp 74 % · axial · 0.39mm/px · z∈[-135,-98]mm · 2 of 276 slices shown]
[im 92/276  lung]
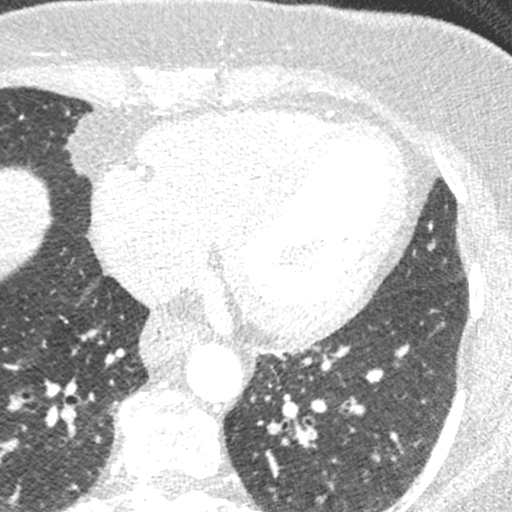
[im 184/276  lung]
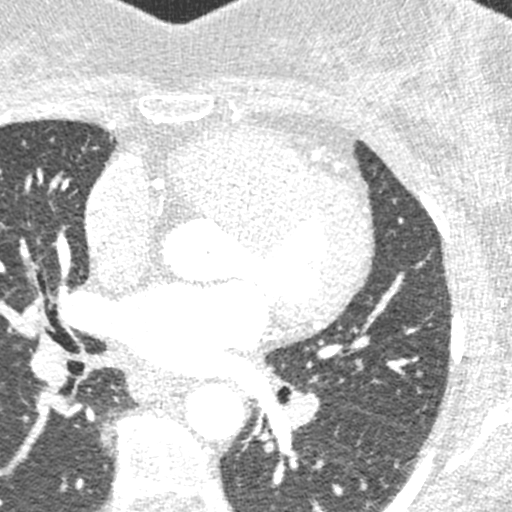

[Series 9: ts syst sharp · axial · 0.39mm/px · z∈[-135,-98]mm · 2 of 276 slices shown]
[im 92/276  lung]
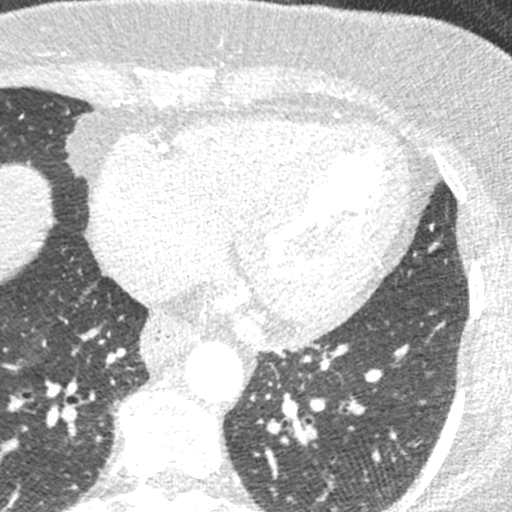
[im 184/276  lung]
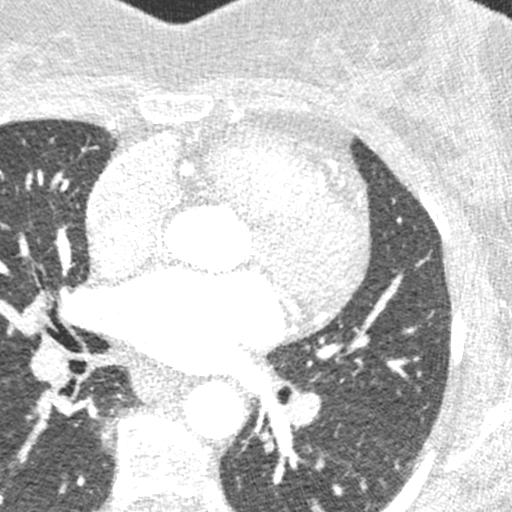

[8 of 20 positions shown; findings below may reference images not displayed]

FINDINGS: Within the visualized portions of the thorax there are no suspicious
appearing pulmonary nodules or masses, there is no acute
consolidative airspace disease, no pleural effusions, no
pneumothorax and no lymphadenopathy. Visualized portions of the
upper abdomen are unremarkable. There are no aggressive appearing
lytic or blastic lesions noted in the visualized portions of the
skeleton.
IMPRESSION: No significant incidental noncardiac findings are noted.
FINDINGS: Non-cardiac: See separate report from [REDACTED]. No
significant findings on limited lung and soft tissue windows.

Calcium score: No calcium noted

Coronary Arteries: Right dominant with no anomalies

LM: Normal

LAD: Normal

IM: Small vessel normal

D1: Normal

D2: Normal

D3: Small Normal

Circumflex: Normal

OM1: Normal

AV Groove: Normal

RCA: Normal

PDA: Normal

PLA: Normal
IMPRESSION: 1. Calcium score 0

2.  Normal aortic root 3.1 cm

3.  Normal right dominant coronary arteries

[facility]

*** End of Addendum ***
EXAM:
OVER-READ INTERPRETATION  CT CHEST

The following report is an over-read performed by radiologist Dr.
Blezz Risher [REDACTED] on 04/05/2018. This
over-read does not include interpretation of cardiac or coronary
anatomy or pathology. The coronary calcium score/coronary CTA
interpretation by the cardiologist is attached.
FINDINGS: Within the visualized portions of the thorax there are no suspicious
appearing pulmonary nodules or masses, there is no acute
consolidative airspace disease, no pleural effusions, no
pneumothorax and no lymphadenopathy. Visualized portions of the
upper abdomen are unremarkable. There are no aggressive appearing
lytic or blastic lesions noted in the visualized portions of the
skeleton.
IMPRESSION: No significant incidental noncardiac findings are noted.
